# Patient Record
Sex: Female | Born: 1955 | Race: White | Hispanic: No | Marital: Married | Smoking: Former smoker
Health system: Southern US, Community
[De-identification: ages and names within clinical notes are randomized; demographics above are authoritative.]

## PROBLEM LIST (undated history)

## (undated) DIAGNOSIS — R9431 Abnormal electrocardiogram [ECG] [EKG]: Secondary | ICD-10-CM

## (undated) DIAGNOSIS — I10 Essential (primary) hypertension: Secondary | ICD-10-CM

## (undated) HISTORY — DX: Abnormal electrocardiogram (ECG) (EKG): R94.31

## (undated) HISTORY — PX: TUBAL LIGATION: SHX77

## (undated) HISTORY — PX: CHOLECYSTECTOMY: SHX55

## (undated) HISTORY — DX: Essential (primary) hypertension: I10

---

## 2001-11-24 ENCOUNTER — Encounter: Admission: RE | Admit: 2001-11-24 | Discharge: 2001-11-24 | Payer: Self-pay | Admitting: Family Medicine

## 2001-11-24 ENCOUNTER — Encounter: Payer: Self-pay | Admitting: Family Medicine

## 2002-08-17 ENCOUNTER — Encounter: Admission: RE | Admit: 2002-08-17 | Discharge: 2002-08-17 | Payer: Self-pay | Admitting: Family Medicine

## 2002-08-17 ENCOUNTER — Encounter: Payer: Self-pay | Admitting: Family Medicine

## 2002-09-01 ENCOUNTER — Other Ambulatory Visit: Admission: RE | Admit: 2002-09-01 | Discharge: 2002-09-01 | Payer: Self-pay | Admitting: Obstetrics and Gynecology

## 2003-01-28 ENCOUNTER — Emergency Department (HOSPITAL_COMMUNITY): Admission: EM | Admit: 2003-01-28 | Discharge: 2003-01-28 | Payer: Self-pay | Admitting: Emergency Medicine

## 2003-01-28 ENCOUNTER — Encounter: Payer: Self-pay | Admitting: Emergency Medicine

## 2003-02-09 ENCOUNTER — Encounter: Payer: Self-pay | Admitting: Internal Medicine

## 2003-02-09 ENCOUNTER — Encounter: Admission: RE | Admit: 2003-02-09 | Discharge: 2003-02-09 | Payer: Self-pay | Admitting: Internal Medicine

## 2003-02-17 ENCOUNTER — Emergency Department (HOSPITAL_COMMUNITY): Admission: EM | Admit: 2003-02-17 | Discharge: 2003-02-17 | Payer: Self-pay | Admitting: *Deleted

## 2003-02-17 ENCOUNTER — Encounter: Payer: Self-pay | Admitting: *Deleted

## 2003-02-18 ENCOUNTER — Encounter: Payer: Self-pay | Admitting: Internal Medicine

## 2003-02-18 ENCOUNTER — Ambulatory Visit (HOSPITAL_COMMUNITY): Admission: RE | Admit: 2003-02-18 | Discharge: 2003-02-18 | Payer: Self-pay | Admitting: Internal Medicine

## 2003-02-18 ENCOUNTER — Encounter (INDEPENDENT_AMBULATORY_CARE_PROVIDER_SITE_OTHER): Payer: Self-pay | Admitting: *Deleted

## 2003-09-05 ENCOUNTER — Other Ambulatory Visit: Admission: RE | Admit: 2003-09-05 | Discharge: 2003-09-05 | Payer: Self-pay | Admitting: Obstetrics and Gynecology

## 2003-10-13 ENCOUNTER — Emergency Department (HOSPITAL_COMMUNITY): Admission: EM | Admit: 2003-10-13 | Discharge: 2003-10-13 | Payer: Self-pay | Admitting: Emergency Medicine

## 2003-10-14 ENCOUNTER — Other Ambulatory Visit (HOSPITAL_COMMUNITY): Admission: RE | Admit: 2003-10-14 | Discharge: 2003-10-14 | Payer: Self-pay | Admitting: Psychiatry

## 2003-11-03 ENCOUNTER — Inpatient Hospital Stay (HOSPITAL_COMMUNITY): Admission: AD | Admit: 2003-11-03 | Discharge: 2003-11-18 | Payer: Self-pay | Admitting: Psychiatry

## 2003-11-07 ENCOUNTER — Encounter (HOSPITAL_COMMUNITY): Payer: Self-pay | Admitting: Psychiatry

## 2003-11-21 ENCOUNTER — Other Ambulatory Visit (HOSPITAL_COMMUNITY): Admission: RE | Admit: 2003-11-21 | Discharge: 2003-12-08 | Payer: Self-pay | Admitting: Psychiatry

## 2004-01-31 ENCOUNTER — Inpatient Hospital Stay (HOSPITAL_COMMUNITY): Admission: RE | Admit: 2004-01-31 | Discharge: 2004-02-16 | Payer: Self-pay | Admitting: Psychiatry

## 2004-03-08 ENCOUNTER — Inpatient Hospital Stay (HOSPITAL_COMMUNITY): Admission: RE | Admit: 2004-03-08 | Discharge: 2004-03-13 | Payer: Self-pay | Admitting: Psychiatry

## 2004-07-18 ENCOUNTER — Encounter: Admission: RE | Admit: 2004-07-18 | Discharge: 2004-07-18 | Payer: Self-pay | Admitting: Internal Medicine

## 2004-07-24 ENCOUNTER — Encounter: Admission: RE | Admit: 2004-07-24 | Discharge: 2004-07-24 | Payer: Self-pay | Admitting: Internal Medicine

## 2004-07-24 ENCOUNTER — Encounter (INDEPENDENT_AMBULATORY_CARE_PROVIDER_SITE_OTHER): Payer: Self-pay | Admitting: *Deleted

## 2004-08-13 ENCOUNTER — Ambulatory Visit: Payer: Self-pay | Admitting: Internal Medicine

## 2004-08-22 ENCOUNTER — Ambulatory Visit: Payer: Self-pay | Admitting: Internal Medicine

## 2004-11-15 ENCOUNTER — Ambulatory Visit: Payer: Self-pay | Admitting: Internal Medicine

## 2005-08-08 ENCOUNTER — Encounter: Admission: RE | Admit: 2005-08-08 | Discharge: 2005-08-08 | Payer: Self-pay | Admitting: Internal Medicine

## 2005-08-22 ENCOUNTER — Ambulatory Visit: Payer: Self-pay | Admitting: Internal Medicine

## 2005-08-23 ENCOUNTER — Ambulatory Visit: Payer: Self-pay | Admitting: Internal Medicine

## 2006-11-14 ENCOUNTER — Encounter: Admission: RE | Admit: 2006-11-14 | Discharge: 2006-11-14 | Payer: Self-pay | Admitting: Internal Medicine

## 2006-11-14 ENCOUNTER — Encounter: Payer: Self-pay | Admitting: Internal Medicine

## 2008-04-27 ENCOUNTER — Telehealth (INDEPENDENT_AMBULATORY_CARE_PROVIDER_SITE_OTHER): Payer: Self-pay | Admitting: *Deleted

## 2009-06-26 ENCOUNTER — Telehealth (INDEPENDENT_AMBULATORY_CARE_PROVIDER_SITE_OTHER): Payer: Self-pay | Admitting: *Deleted

## 2009-07-14 ENCOUNTER — Ambulatory Visit: Payer: Self-pay | Admitting: Internal Medicine

## 2009-07-14 DIAGNOSIS — E049 Nontoxic goiter, unspecified: Secondary | ICD-10-CM

## 2009-07-14 DIAGNOSIS — R002 Palpitations: Secondary | ICD-10-CM | POA: Insufficient documentation

## 2009-07-14 DIAGNOSIS — F329 Major depressive disorder, single episode, unspecified: Secondary | ICD-10-CM

## 2009-07-14 DIAGNOSIS — M810 Age-related osteoporosis without current pathological fracture: Secondary | ICD-10-CM

## 2009-07-21 ENCOUNTER — Encounter: Payer: Self-pay | Admitting: Internal Medicine

## 2009-07-21 ENCOUNTER — Encounter: Admission: RE | Admit: 2009-07-21 | Discharge: 2009-07-21 | Payer: Self-pay | Admitting: Internal Medicine

## 2009-07-24 ENCOUNTER — Encounter (INDEPENDENT_AMBULATORY_CARE_PROVIDER_SITE_OTHER): Payer: Self-pay | Admitting: *Deleted

## 2009-08-08 ENCOUNTER — Encounter (INDEPENDENT_AMBULATORY_CARE_PROVIDER_SITE_OTHER): Payer: Self-pay | Admitting: *Deleted

## 2010-02-05 ENCOUNTER — Encounter: Payer: Self-pay | Admitting: Internal Medicine

## 2010-02-23 ENCOUNTER — Ambulatory Visit: Payer: Self-pay | Admitting: Internal Medicine

## 2010-02-23 DIAGNOSIS — T887XXA Unspecified adverse effect of drug or medicament, initial encounter: Secondary | ICD-10-CM

## 2010-02-23 DIAGNOSIS — E559 Vitamin D deficiency, unspecified: Secondary | ICD-10-CM

## 2010-02-23 DIAGNOSIS — R740 Nonspecific elevation of levels of transaminase and lactic acid dehydrogenase [LDH]: Secondary | ICD-10-CM

## 2010-02-26 LAB — CONVERTED CEMR LAB
ALT: 77 units/L — ABNORMAL HIGH (ref 0–35)
AST: 49 units/L — ABNORMAL HIGH (ref 0–37)
Albumin: 4.4 g/dL (ref 3.5–5.2)
Alkaline Phosphatase: 85 units/L (ref 39–117)
Bilirubin, Direct: 0.2 mg/dL (ref 0.0–0.3)
Hgb A1c MFr Bld: 5.7 % (ref 4.6–6.5)
Total Bilirubin: 0.7 mg/dL (ref 0.3–1.2)
Total Protein: 7.4 g/dL (ref 6.0–8.3)

## 2010-07-20 ENCOUNTER — Telehealth (INDEPENDENT_AMBULATORY_CARE_PROVIDER_SITE_OTHER): Payer: Self-pay | Admitting: *Deleted

## 2010-08-24 ENCOUNTER — Encounter: Admission: RE | Admit: 2010-08-24 | Discharge: 2010-08-24 | Payer: Self-pay | Admitting: Internal Medicine

## 2010-10-27 ENCOUNTER — Encounter (HOSPITAL_COMMUNITY): Payer: Self-pay | Admitting: Psychiatry

## 2010-10-27 ENCOUNTER — Encounter: Payer: Self-pay | Admitting: Internal Medicine

## 2010-11-06 NOTE — Assessment & Plan Note (Signed)
Summary: DISCUSS LABS FROM DR [facility] OFFICE/RH......   Vital Signs:  Patient profile:   55 year old female Weight:      235 pounds Pulse rate:   82 / minute Resp:     15 per minute BP sitting:   122 / 86  (left arm) Cuff size:   large  Vitals Entered By: Shonna Chock (Feb 23, 2010 9:58 AM) CC: Discuss Labs  Comments REVIEWED MED LIST, PATIENT AGREED DOSE AND INSTRUCTION CORRECT    CC:  Discuss Labs .  History of Present Illness: Dr Carie Caddy  annual labs reviewed ; on 02/05/2010 ALT 262 (<41) & AST 92 ( < 39). Vitamin D was 36. Lipids were @ goal w/o statin. No specific diet; no alcohol, excess Tylenol or vitamin A supplementation. No PMH of hepatitis. Meds reviewed : Abilify is metabolized by liver as per Epocrates; Lamictil is not.  Allergies: 1)  ! Zithromax  Review of Systems General:  Denies chills and fever; Weight loss of 30# with decreased portions. GI:  Denies abdominal pain, bloody stools, dark tarry stools, indigestion, nausea, vomiting, and yellowish skin color; No  clay colored stool . GU:  No dark urine.  Physical Exam  General:  in no acute distress; alert,appropriate and cooperative throughout examination Eyes:  No corneal or conjunctival inflammation noted. Perrla. No icterus Mouth:  Oral mucosa and oropharynx without lesions or exudates.  No erythema Lungs:  Normal respiratory effort, chest expands symmetrically. Lungs are clear to auscultation, no crackles or wheezes. Heart:  Normal rate and regular rhythm. S1 and S2 normal without gallop, murmur, click, rub. S4 Abdomen:  Bowel sounds positive,abdomen soft and non-tender without masses, organomegaly or hernias noted. Skin:  Intact without suspicious lesions or rashes. No icterus Cervical Nodes:  No lymphadenopathy noted Axillary Nodes:  No palpable lymphadenopathy Psych:  memory intact for recent and remote, normally interactive, and good eye contact.     Impression & Recommendations:  Problem # 1:   NONSPEC ELEVATION OF LEVELS OF TRANSAMINASE/LDH (ICD-790.4) R/O drug effect vs Fatty Infiltration of liver(NASH) Orders: TLB-Hepatic/Liver Function Pnl (80076-HEPATIC) Venipuncture (16109)  Problem # 2:  VITAMIN D DEFICIENCY (ICD-268.9)  Problem # 3:  UNS ADVRS EFF UNS RX MEDICINAL&BIOLOGICAL SBSTNC (ICD-995.20)  Diabetes risk assessment necessary due to Abilify Rx & body habitus suggesting Metabolic Syndrome  Orders: TLB-A1C / Hgb A1C (Glycohemoglobin) (83036-A1C) Venipuncture (60454)  Complete Medication List: 1)  Boniva 150 Mg Tabs (Ibandronate sodium) .... Take 1 tablet by mouth each month 2)  Abilify 20 Mg Tabs (Aripiprazole) .Marland Kitchen.. 1 by mouth once daily 3)  Lomotrigine 25mg   .... 1 by mouth once daily 4)  Diltiazem Hcl Er Beads 240 Mg Xr24h-cap (Diltiazem hcl er beads) .Marland Kitchen.. 1 by mouth once daily 5)  Vitamin D 1000 Unit Tabs (Cholecalciferol) .Marland Kitchen.. 1 by mouth once daily 6)  Calcium 600 Mg Tabs (Calcium) .... 3 by mouth once daily  Patient Instructions: 1)  Consume LESS THAN 40 of "sugar" / day from foods & drinks with High Fructose Corn Syrup as #1,2 or  # 3 on label. Increase vitamin D 3 by 1000 IU daily.

## 2010-11-06 NOTE — Progress Notes (Signed)
Summary: Refill Request  Phone Note Refill Request Call back at [phone] Message from:  Pharmacy on July 20, 2010 8:01 AM  Refills Requested: Medication #1:  BONIVA 150 MG  TABS take 1 tablet by mouth each month   Dosage confirmed as above?Dosage Confirmed   Supply Requested: 1 month   Last Refilled: 06/21/2010 [facility]  Next Appointment Scheduled: none Initial call taken by: Harold Barban,  July 20, 2010 8:01 AM    Prescriptions: BONIVA 150 MG  TABS (IBANDRONATE SODIUM) take 1 tablet by mouth each month  #1 x 11   Entered by:   Shonna Chock CMA   Authorized by:   Marga Melnick MD   Signed by:   Shonna Chock CMA on 07/20/2010   Method used:   Electronically to        [facility]* (retail)       [address]       [place], [place]  [address]       Ph: [phone]       Fax: [phone]   RxID:   [id number]   Appended Document: Refill Request Called in rx to [facility], they didnt receive on 07/20/2010

## 2011-02-22 NOTE — Discharge Summary (Signed)
NAME:  Michelle Hunt, Michelle Hunt                      ACCOUNT NO.:  [id number]   MEDICAL RECORD NO.:  [id number]                   PATIENT TYPE:  IPS   LOCATION:  0300                                 FACILITY:  [facility]   PHYSICIAN:  Jeanice Lim, M.D.              DATE OF BIRTH:  Nov 14, 1955   DATE OF ADMISSION:  03/08/2004  DATE OF DISCHARGE:  03/13/2004                                 DISCHARGE SUMMARY   IDENTIFYING DATA:  This is a 55 year old married Caucasian female  voluntarily admitted with history of frequent admissions recently with  worsening mood, not able to tolerate multiple antidepressants, sensitive to  most medications, having various side effects at times objectively  witnessed, other times not clear.  The patient does report increased  paranoid thinking, suicidal thoughts, feeling unsafe outside the hospital,  gradual worsening in anxiety, which is the primary complaint along with  depressed mood, sense of doom, feeling that she is no longer going to be  able to make it and will not respond to medications since she has failed  just about every medicine on the market.  The patient is followed up by  Geoffery Lyons, M.D., outpatient.  This is the second hospitalization here;  last one two to three weeks ago.  No substance abuse history.   PAST MEDICAL HISTORY:  Primary care doctor is Dr. Mayford Knife in [place].  Hypertension is the only medical problem.   MEDICATIONS:  1. Klonopin, which she has been able to tolerate.  2. Depakote, which she took for two days.   She has been previously tried on multiple antipsychotics, mood stabilizers,  antidepressants, serotonin medicines, serotonin/norepinephrine medicines.   DRUG ALLERGIES:  CODEINE and possible CYMBALTA, questionable LAMICTAL.   PHYSICAL EXAMINATION:  GENERAL:  Essentially within normal limits.  NEUROLOGIC:  Nonfocal.   LABORATORY DATA:  Routine admission labs:  Within normal limits.   MENTAL STATUS EXAM:  Alert,  middle-aged female, cooperative, fair eye  contact.  Speech:  Clear.  Mood: Depressed.  Affect: Blunted, complaining of  significant anxiety and some paranoia, feeling that she is being watched  with anxiety as her primary complaint.  Cognitive: Intact.  Judgment and  insight: Fair.   ADMISSION DIAGNOSES:   AXIS I:  1. Rule out bipolar II disorder versus major depression, recurrent, severe     with psychotic features.  2. Generalized anxiety disorder refractory to medication treatment in part     due to tolerance of medications.   AXIS II:  Deferred.   AXIS III:  Hypertension.   AXIS IV:  Moderate problems with psychosocial stressors, limited support  system, chronic depression, and severe anxiety which has worsened over the  years.   AXIS V:  30/55   HOSPITAL COURSE:  The patient was admitted, ordered routine p.r.n.  medications, underwent further monitoring, and was encouraged to participate  in individual, group, and milieu therapy.  Upon admission, it was clear that  the patient was unable to tolerate medications that would treat her severe  anxiety and depression, which appeared to be gradually worsening and now  occurred along with suicidal thoughts and paranoid thinking.  Therefore, ECT  was recommended.  The patient was given ECT information video.  Family was  given information.  This was reviewed with the psychiatrist on several  occasions.  Risks, benefits, and alternative treatments were reviewed and it  was clear that it was in the patient's best interest and she was quite  motivated to receive ECT due to her feeling desperate and unsafe without  effective treatment and being unable to tolerate medications.  Geoffery Lyons,  M.D., recommended ECT, who is her outpatient psychiatrist and case  management facilitated transfer to [facility] for inpatient  stabilization of depression and anxiety via ECT.   CONDITION ON DISCHARGE:  The patient was discharged in an  unchanged  condition, still suicidal with paranoid ideation, able to contract for  safety until getting to the hospital for ECT.   DISCHARGE MEDICATIONS:  Medications were to be continued as on the  medication administration record.   FOLLOW UP:  She is to receive ECT at [facility] on June 7.   DISCHARGE DIAGNOSES:   AXIS I:  1. Rule out bipolar II disorder versus major depression, recurrent, severe     with psychotic features.  2. Generalized anxiety disorder refractory to medication treatment in part     due to tolerance of medications.   AXIS II:  Deferred.   AXIS III:  Hypertension.   AXIS IV:  Moderate problems with psychosocial stressors, limited support  system, chronic depression, and severe anxiety which has worsened over the  years.   AXIS V:  Global assessment of functioning on discharge was 45.                                               Jeanice Lim, M.D.    JEM/MEDQ  D:  03/28/2004  T:  03/30/2004  Job:  [id number]

## 2011-02-22 NOTE — H&P (Signed)
NAME:  Michelle Hunt, Michelle Hunt                      ACCOUNT NO.:  [id number]   MEDICAL RECORD NO.:  [id number]                   PATIENT TYPE:  IPS   LOCATION:  0300                                 FACILITY:  [facility]   PHYSICIAN:  Jeanice Lim, M.D.              DATE OF BIRTH:  08-Apr-1956   DATE OF ADMISSION:  11/03/2003  DATE OF DISCHARGE:                         PSYCHIATRIC ADMISSION ASSESSMENT   IDENTIFYING INFORMATION:  This is a  55 year old married white female who is  a voluntary admission.   HISTORY OF PRESENT ILLNESS:  This 55 year old female presented to the  hospital with her daughter after having a strange sensation come over her  while she was out shopping with her.  She had a flash of a vision of  stabbing her father and her son and became very scared by this.  This was  her first experience of having any kind of homicidal thought or homicidal  vision and she says that this is not typical of her.  She reports a history  of anxiety, feeling fearful, with rapid pulse and sweats, that has been  going on for the past several months, with symptoms first being noted in  March of 2004.  She reports she has been much more anxious over the past 3-4  weeks.  She thinks that her symptoms may have originated after a crisis at  work about 3 years ago when she got a new supervisor that made multiple  changes in her work routine.  The patient was hospitalized in August of 2004  at [facility] in an effort to get to the bottom of her symptoms and she  says they discharged her after 2 days on Remeron 15 mg at night.  She  describes difficulty sleep, with rapid pulse and sweating episodes, waking  up in the middle of the night, has had episodes of frequent panic and has  difficulty getting past these symptoms in spite of multiple attempts by her  family to reassure her.  Her father has taken to living with her because she  is afraid to be alone.  She has also complained of random muscle  aches and  pains.  The patient is terrified of getting panicky, feels out of control of  her feelings and her mood, and states I feel like someone has sucked the  life out of me.  She also reports frequent crying spells.  At one point,  she became angry and frustrated after leaving [facility] and chopped all  of her hair off, which she says is very unlike her.  She endorses feeling of  suicide, feeling she is unable to go on like this, but has no specific plan.   PAST PSYCHIATRIC HISTORY:  The patient has been followed by Dr. Rhodia Albright  and Lewie Loron who is her psychotherapist.  This is her first admission  to [facility].  She denies any childhood history of  abuse, denies any history of prior suicidal thoughts or suicide attempt.   SOCIAL HISTORY:  This is a married white female who lives at home with her  husband, and her father has been staying with her recently.  No legal  problems.  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  She reports her  husband and family are supportive.   FAMILY HISTORY:  Remarkable for a mother with a history of paranoia.   ALCOHOL AND DRUG HISTORY:  She denies any history of alcohol or drug use.   PAST MEDICAL HISTORY:  The patient's primary care physician is Dr. Eula Listen,  M.D.  She has also been to see Dr. Trey Sailors, a neurosurgeon, who found her  with a negative CT scan.  She has seen Dr. Mayford Knife in the past for  palpitations and had a negative cardiac workup, and was also seen by an  endocrinologist for workup of a thyroid nodule which was also essentially  negative.   MEDICATIONS:  Nexium 40 mg p.o. daily, Klonopin 0.5 mg 1 whole to 1/2 tab  p.o. t.i.d., Diltiazem 100 mg p.o. daily, fish oil 1000 mg 2 tabs b.i.d.,  Seroquel 25 mg p.o. q.h.s..  She tried taking Lexapro but reports that her  blood pressure went up on that.  She had also been on  a Climara patch to  which she was intolerant.  She has been using her Klonopin somewhat  irregularly.  The patient has also been on Lamictal in the past but she  found that it made her heart race and has also taken Elavil in the past but  states that it had made her very nervous so she stopped it.   REVIEW OF SYSTEMS:  Remarkable for feelings of anxiety, tension, she has had  considerable difficulty sleeping.  Denies any palpitations today, denies any  auditory or visual hallucinations, endorses anhedonia and a generalized  sense of anxiety.   POSITIVE PHYSICAL FINDINGS:  This is a well-nourished, well-developed white  female who is in no acute distress.  Vital signs on admission to the unit:  She is afebrile, pulse 95, respirations 20, blood pressure 158/70.  She is 5  feet 5 inches tall, weighs 129 pounds.  HEAD:  Normocephalic and atraumatic.  EENT:  Extraocular movements are  intact, sclerae are nonicteric.  Pupils equal round and reactive to light  and accommodation.  Ear canals are patent.  No rhinorrhea.  Oropharynx is  intact.  NECK:  Supple, no thyromegaly or lymphadenopathy.  CHEST:  Symmetrical, lungs are clear to auscultation.  CARDIOVASCULAR:  S1 and S2 is heard.  Her apical rate is regular at this  point at 68, synchronous with radial pulse.  Distal pulses are 2+/5.  EXTREMITIES:  Pink and warm with no peripheral edema.  No JVD.  ABDOMEN:  Flat, soft, nontender, nondistended.  GENITOURINARY and BREAST EXAM:  DEFERRED.  SKIN:  Intact.  Distal pulses 2+/5.  No rashes, no lesions.  NEUROLOGIC:  Cranial nerves II-XII intact.  EOMs are intact.  Cerebellar  function is intact and gait is normal.  Rapid alternating movements are  normal.  Facial and motor symmetry is present.  No focal findings.   DIAGNOSTIC STUDIES:  Normal CBC and metabolic panel.  Her thyroid panel is  currently pending.   MENTAL STATUS EXAM:  This is a fully alert female who is pleasant and cooperative,  with  a sad and blunted affect and a somewhat anxious affect.  Speech is normal in pace, tone and amount.  She initiates conversation well,  is quite articulate.  Mood is depressed and anxious.  Thought process is  negative for any racing thoughts, positive for vague, passive suicidal  ideation, feeling hopeless and helpless.  No homicidal ideation, no auditory  or visual hallucinations, no signs of internal distraction.  Cognitively she  is intact and oriented x3.  Intelligence is within normal limits.  Insight  is satisfactory.  Impulse control and judgment within normal limits.   ADMISSION DIAGNOSIS:   AXIS I:  Panic disorder not otherwise specified, rule out bipolar disorder.   AXIS II:  No diagnosis.   AXIS III:  Thyroid nodule not otherwise specified.   AXIS IV:  Moderate stress from her own mental illness and having supportive  family is an asset.   AXIS V:  Current 31, past year 48.   INITIAL PLAN OF CARE:  Voluntarily admit the patient with q.15 checks in  place.  We have spoken to Dr. Trey Sailors who was contacted by the patient's  son.  He states he is familiar with the patient and is concerned that she  get a thorough workup here.  Although he had a CT scan of her brain in March  of 2004, he had negative results.  He is concerned that patient has had some  recent significant symptoms of homicidal thoughts and possibly intrusive  thoughts and is offering his services to help [place] interpret any imaging  studies we may need.  We are going to put her on a regular dose of Klonopin  at 0.5 mg 1/2 tablet t.i.d. and we will recheck her thyroid series and go  from there.  We have discussed the plan of care with the patient and she is  in full agreement with this, and we have the case managers contact her  family with her agreement.   ESTIMATED LENGTH OF STAY:  5 days.     Margaret A. Stephannie Peters                   Jeanice Lim, M.D.    MAS/MEDQ  D:  11/10/2003  T:   11/10/2003  Job:  [id number]

## 2011-02-22 NOTE — H&P (Signed)
NAME:  Michelle Hunt, Michelle Hunt                      ACCOUNT NO.:  [id number]   MEDICAL RECORD NO.:  [id number]                   PATIENT TYPE:  IPS   LOCATION:  0300                                 FACILITY:  [facility]   PHYSICIAN:  Geoffery Lyons, M.D.                   DATE OF BIRTH:  1956-07-05   DATE OF ADMISSION:  03/08/2004  DATE OF DISCHARGE:                         PSYCHIATRIC ADMISSION ASSESSMENT   IDENTIFYING INFORMATION:  A 55 year old married white female, voluntarily  admitted on March 08, 2004.   HISTORY OF PRESENT ILLNESS:  The patient presents with a history of  depression, racing thoughts.  The patient states she has been wanting to cut  herself really bad.  The patient is afraid that someone is after her.  She  reports that she is having a lot of problems with the medication.  She  states she found herself waking up in the middle of the night after a new  medication was started recently.  The patient has continued anxiety, is  afraid to be alone.  She denies any auditory hallucinations.  Her appetite  has been satisfactory, with a 1 pound weight loss.   PAST PSYCHIATRIC HISTORY:  Second hospitalization, was here about 2-3 weeks  ago for depression, sees Dr. Dub Mikes as an outpatient.   SOCIAL HISTORY:  She is a 55 year old married white female, married for 30  years, has 2 children, worked as a Midwife for 15 years.   FAMILY HISTORY:  Unclear.   ALCOHOL DRUG HISTORY:  The patient smokes, denies any alcohol or drug use.   PAST MEDICAL HISTORY:  Primary care Jaquis Picklesimer is Dr. Mayford Knife in [place].  Medical problems are hypertension.   MEDICATIONS:  The patient has been on Klonopin 1 mg q.i.d., Depakote 250 mg  every evening for the past 2 days, Diltiazem 240 mg daily, Protonix 40 mg  daily.   DRUG ALLERGIES:  CODEINE and CYMBALTA which she reports a rash.   PHYSICAL EXAMINATION:  Done.  This is a well-nourished, well-developed  female in no acute distress.  97.4, 16  respirations, blood pressure is  146/100.  She is 5 feet 4 inches tall, 140 pounds.  Nonfocal neurological  findings.  Able to easily perform heel-to-shin and normal alternating  movements.  Hemoglobin is 15.3, valporic acid level is 27, urine drug screen  is positive for benzodiazepines.   MENTAL STATUS EXAM:  She is an alert middle-aged female, cooperative, fair  eye contact.  Casually dressed, speech clear, mood is depressed and afraid,  affect is constricted.  Thought processes positive paranoia, no delusions,  no ideas of reference, does not appear to be responding to internal stimuli.  Cognitive function intact, memory is good, judgment is fair, insight is  fair.   ADMISSION DIAGNOSES:   AXIS I:  Major depression, recurrent, severe, rule out bipolar disorder.   AXIS II:  Deferred.   AXIS III:  Hypertension.  AXIS IV:  Other psychosocial problems related to mental illness.   AXIS V:  Current is 30, past year 66.   PLAN:  Admission for depression and suicidal ideation and some questionable  paranoid ideation.  Contract for safety, stabilize mood and thinking.  At  this time, we will resume her medications and check Depakote level.  The  patient may need ECT treatments.  Will need to be educated and obtain family  support.  The patient is to follow up with Dr. Dub Mikes.   TENTATIVE LENGTH OF CARE:  4-6 days.     Landry Corporal, N.P.                       Geoffery Lyons, M.D.    JO/MEDQ  D:  03/13/2004  T:  03/13/2004  Job:  [id number]

## 2011-02-22 NOTE — Discharge Summary (Signed)
NAME:  Michelle Hunt, Michelle Hunt                      ACCOUNT NO.:  [id number]   MEDICAL RECORD NO.:  [id number]                   PATIENT TYPE:  IPS   LOCATION:  0305                                 FACILITY:  [facility]   PHYSICIAN:  Geoffery Lyons, M.D.                   DATE OF BIRTH:  May 15, 1956   DATE OF ADMISSION:  01/31/2004  DATE OF DISCHARGE:  02/16/2004                                 DISCHARGE SUMMARY   CHIEF COMPLAINT AND PRESENT ILLNESS:  This was the second admission to [facility] for this 55 year old married white female, who has  continued to experience symptoms of severe anxiety as well as mood  fluctuation.  Became very fearful for unknown reasons.  Unable to work.  Had  become more depressed, anxious.  Has been going on for a year, depressed as  well.  Worried about health problems.  Suicidal ruminations.  Suicidal  images.  Was admitted back in January.  She had a similar episode where she  had a flash of stabbing of her father and her son.  Became very scared.  Although she endorsed no suicidal or homicidal ideation.   PAST PSYCHIATRIC HISTORY:  Has been in [facility] as well as [facility].   ALCOHOL/DRUG HISTORY:  Denies the use or abuse of any substances.   PAST MEDICAL HISTORY:  Hypertension, gastroesophageal reflux, tachycardia.   MEDICATIONS:  Klonopin 0.5 mg three times a day, Neurontin 300 mg three  times a day, diltiazem 240 mg in the morning, Protonix 40 mg daily and  Ambien 10 mg at bedtime for sleep.   PHYSICAL EXAMINATION:  Performed and failed to show any acute findings.   LABORATORY DATA:  CBC within normal limits.  Blood chemistry within normal  limits.  TSH 0.563.  Drug screen negative for substances of abuse.   MENTAL STATUS EXAM:  Alert, oriented female.  Well-groomed.  Appropriately  dressed.  Speech was normal rate, rhythm and tone.  Mood was anxiety.  Affect was anxious.  Thought processes were clear, rational, goal-oriented,  somatically  focused but there was no evidence of paranoia or hallucinations.  Has had images of using a gun and hurting herself.  Cognition well-  preserved.   ADMISSION DIAGNOSES:   AXIS I:  1. Anxiety disorder not otherwise specified.  2. Mood disorder not otherwise specified.   AXIS II:  No diagnosis.   AXIS III:  1. Hypertension.  2. Gastroesophageal reflux.  3. Tachycardia.   AXIS IV:  Moderate.   AXIS V:  Global Assessment of Functioning upon admission 30; highest Global  Assessment of Functioning in the last year 55-60.   HOSPITAL COURSE:  She was admitted and started intensive individual and  group psychotherapy.  Reviewing her medications, she has been on Zoloft,  Cymbalta, Remeron, Effexor, Lexapro and Paxil.  Has also been on Lamictal  and Neurontin as well as Zyprexa, Seroquel and Risperdal  and Klonopin,  Ativan, Tranxene and Xanax with mostly CBS side effects, unable to tolerate  after appropriate dose.  A lot of physical complaints.  We continued to work  to explore options.  She was maintained on the Klonopin.  She was given some  Seroquel, which caused some sides and it was discontinued.  Neurontin was  discontinued and she was placed on Trileptal 150 mg three times a day and  Klonopin.  She was also started on Celexa 10 mg per day.  There was the  achiness and the pain.  She was given Flexeril and naproxen.  This was  contributing to making her sedated, so we discontinued it.  Continued to  work with the Celexa.  She continued to endorse the physical symptoms, the  pain, the crazy spells, the fear.  Pain in arms, feeling tense, anxious,  depressed.  Endorsing suicidal ruminations.  In the course of the stay, she  also became very upset when found out husband was drinking.  Started  catastrophizing and anticipating the worst.  She endorsed difficulty with  the Celexa, sedation, worrying, so we tried to simplify the medication  regime.  Fearful, sense of hopelessness and  helplessness, guilty for not  getting better, some anxiety, felt the Celexa was doing it, seeing visual  images of hurting self with a knife, was given Ativan but it was not strong  enough and it did not do much for her, so we started Geodon that she seemed  to tolerate.  We went Geodon 20 mg twice a day.  The fear was a major issue.  Tolerated the Geodon 20 mg twice a day and the Celexa 20 mg.  By Feb 16, 2004, she was better.  She said that she felt that she was ready to go home.  Endorsed no suicidal or homicidal ideation.  No major side effects to the  Geodon and the Celexa, so we went ahead and continued working with two  medications and she was discharged to outpatient follow-up.   DISCHARGE DIAGNOSES:   AXIS I:  1. Mood disorder not otherwise specified.  2. Anxiety disorder not otherwise specified.   AXIS II:  Deferred.   AXIS III:  1. Arterial hypertension.  2. Gastroesophageal reflux.   AXIS IV:  Moderate.   AXIS V:  Global Assessment of Functioning upon discharge 55.   DISCHARGE MEDICATIONS:  1. Klonopin 0.5 mg three times a day and 2 at bedtime.  2. Trileptal 150 mg, 1 three times a day.  3. Ambien 10 mg at bedtime for sleep.  4. Geodon 20 mg twice a day.  5. Celexa 20 mg, 1/2 twice a day.  6. Protonix 40 mg daily.   FOLLOWUP:  E[facility] Fines and Dr. Dub Mikes.                                               Geoffery Lyons, M.D.    IL/MEDQ  D:  03/07/2004  T:  03/08/2004  Job:  [id number]

## 2011-02-22 NOTE — Discharge Summary (Signed)
NAME:  Michelle Hunt, Michelle Hunt                      ACCOUNT NO.:  [id number]   MEDICAL RECORD NO.:  [id number]                   PATIENT TYPE:  IPS   LOCATION:  0300                                 FACILITY:  [facility]   PHYSICIAN:  Geoffery Lyons, M.D.                   DATE OF BIRTH:  Dec 11, 1955   DATE OF ADMISSION:  11/03/2003  DATE OF DISCHARGE:  11/18/2003                                 DISCHARGE SUMMARY   CHIEF COMPLAINT AND PRESENTING ILLNESS:  This was the first admission to  [facility]  for this 55 year old married white female,  voluntarily admitted.  Presented to the hospital with her daughter, having  had a strange sensation come over her when she was out shopping.  Flash of a  vision of stabbing her father and her son.  Became very scared.  First  experience of having any kind of homicidal thoughts or homicidal ideation.  She felt this was not typical of her.  History of anxiety, feeling fearful,  rapid pulse and sweats for past several months.  Symptoms first noted in  March 2004.  Much more anxious over the past 3-4 weeks prior to this  admission.  She claimed that she felt that the crisis originated after a  crisis at work 3 years prior to this admission when she got as new  supervisor that made multiple changes in her work route.  She was admitted  to [facility] in August 2004 for 2 days, discharged on Remeron.  Episodes  of frequent panic, difficult getting past these symptoms in despite of  multiple attempts by her family to reassure her.  Father had taken to living  with her because she was afraid to be alone.  Also complained of random  muscle aches and pain.  Terrified of getting panicky.  Out of control of her  feelings and her mood.  Reports frequent crying spells.   PAST PSYCHIATRIC HISTORY:  Followed by Dr. Gilles Chiquito and Eual Fines.   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  Reported that her husband and family were supportive.   ALCOHOL AND DRUG HISTORY:  Denies the use or abuse of any substances.   PAST MEDICAL HISTORY:  Has been seen by Dr. Channing Mutters, neurosurgeon, did a CT scan  which was negative.  Has seen Dr. Mayford Knife for consultation and negative  cardiac workup.  Also by an endocrinologist for workup of apparent thyroid  nodule which was essentially negative.   MEDICATIONS:  Nexium 40 mg daily, Klonopin 0.5 1/2 to 1 3 times a day as  needed, Diltiazem 100 mg daily, fish oil 1000 mg 2 tabs twice a day,  Seroquel 25 at bedtime.  Tried to use Lexapro but reports blood pressure  went up.  Using the Klonopin  somewhat irregularly.  Had been on Lamictal in  the past but found it made her heart race, also Elavil was stopped, both for  the same reason.   PHYSICAL EXAMINATION:  Performed, failed to show any acute findings.   LABORATORY WORKUP:  CBC within normal limits.  Blood chemistries were within  normal limits.  Liver profile within normal limits.  Thyroid profile within  normal limits.   MENTAL STATUS EXAM:  Reveals a fully alert female, pleasant, cooperative,  with a sad and blunted affect, somewhat anxious.  Speech was normal in pace,  tone and amount.  Initiates conversation well, quite articulate.  Mood is  depressed and anxious.  Thought processes are negative for any racing  thoughts, positive for vague, passive suicidal ideas, feeling hopeless and  helpless.  No homicidal ideas, no hallucinations.  Cognition well preserved.   ADMISSION DIAGNOSES:   AXIS I:  1. Anxiety disorder not otherwise specified.  2. Mood disorder not otherwise specified.   AXIS II:  No diagnosis.   AXIS III:  Thyroid nodules.   AXIS IV:  Moderate.   AXIS V:  Global assessment of function upon admission 31, highest global  assessment of function in past year 68.   COURSE IN HOSPITAL:  She  was admitted and started on  intensive individual  and group psychotherapy.  She was given Ambien for sleep, Nexium 40 mg per  day, Klonopin 0.5 1/2 to 1 3 times a day, Diltiazem 240 mg daily, fish oil  1000 2 caps twice a day.  She was initially very, very resistant to try any  medications.  She was able to talk about the development of the symptoms,  the panic, the generalized anxiety, the images of hurting her son and  father, almost compulsive in nature.  She has used Lexapro, Remeron, Paxil,  Xanax, Elavil with side effects or no benefit.  We discussed the possibility  of using Zoloft starting really low at 12.5 and because she was denying any  suicidal or homicidal ideation, but remained mostly somatically focused.  Continued to evidence the anxiety and the depression, fearful of the  medications, was eventually able and willing to take the Zoloft 12.5.  Fear  of being out of control.  Felt quite uneasy, unsure of what was going on,  easily overwhelmed, with minimal physical symptoms.  Experiencing some  temperature changes, cold, hot.  We checked the TSH and LH which basically  were within normal range.  Continued to endorse physical complaints, waking  up with muscle aches, which triggered the worries and the complaints,  feeling not well.  She was seen by internal medicine to rule out any  possible disease or any of the fatty disorders.  Evaluation was within  normal limits.  At one time she was very concerned that the husband might  have been back drinking, but he was able to reassure her that he was not.  She started feeling that she at least was able to tolerate the Zoloft and we  pursued the Zoloft further.  Continued to endorse the anxiety and the  depression.  Somatically focused, worried, concerned.  We went ahead and  added Neurontin 100 3 times a day for any possible mood swings and anxiety.  She developed symptoms of becoming very irritable, very frustrated.  The internal medicine diagnosis was the  possibility of fibromyalgia.  Session  with her son as well as her husband.  She started experiencing what seemed  to be  when she was somewhat brighter, evidenced a fuller affect.  We  discussed other options in terms of cognitive behavioral therapy, identified  some of the distortions in terms of catastrophizing and all these negative  thoughts that came into her mind.  Continued to work with the Zoloft and was  able to increase it to 50, continued to work with the [employer].  Still  reporting changes in internal temperature.  Some other events came.  She was  able to look into them.  We went ahead and increased the Zoloft to 25 3  times a day.  Endorsed that she took naps and woke up upset, especially in  the afternoon.  Encouraged not to take the naps as this was going to help  her be able to fall asleep and stay asleep through the night.  Encouraged to  challenge the negative thoughts.  On February 11 it was felt that she had  obtained full benefit and she was indeed much better.  There was no evidence  of suicidal or homicidal ideas, no hallucinations, no delusions.  Objectively she was better.  Had grown dependent on the safety of the  inpatient unit.  It was felt that she was tolerating the medication well and  that she needed some more time for the medication to really work as well as  it could.  She was willing to come to the mental health intensive outpatient  program, so on February 11 we went ahead and discharged to outpatient  followup.   DISCHARGE DIAGNOSES:   AXIS I:  1. Anxiety disorder not otherwise specified.  2. Mood disorder not otherwise specified.   AXIS II:  No diagnosis.   AXIS III:  Thyroid nodule.   AXIS IV:  Moderate.   AXIS V:  Global assessment of function upon discharge 50.   DISCHARGE MEDICATIONS:  1. Neurontin 100 two 3 times a day.  2. Nexium 40 mg per day.  3. Diltiazem 240 mg daily.  4. Klonopin 0.5 1/2 twice a day and at bedtime.  5. Zoloft  25 mg one 3 times a day.  6. Ambien 10 at bedtime for sleep.   DISPOSITION:  Follow up at mental health IOP.                                               Geoffery Lyons, M.D.    IL/MEDQ  D:  12/14/2003  T:  12/15/2003  Job:  [id number]

## 2011-02-22 NOTE — H&P (Signed)
NAME:  Michelle Hunt, Michelle Hunt                      ACCOUNT NO.:  [id number]   MEDICAL RECORD NO.:  [id number]                   PATIENT TYPE:  IPS   LOCATION:  0305                                 FACILITY:  [facility]   PHYSICIAN:  Geoffery Lyons, M.D.                   DATE OF BIRTH:  1956-05-09   DATE OF ADMISSION:  01/31/2004  DATE OF DISCHARGE:                         PSYCHIATRIC ADMISSION ASSESSMENT   This is a 55 year old married white female who presented for admission this  morning.  The patient was here with [place] back in January under similar  circumstances.  Today she reports she is having increased anxiety.  She is  very fearful for unknown reasons.  She is unable to work because of this.  She has now become depressed.  Initially she was just anxious, but as this  has been going on for over a year now, she is depressed as well. She worries  about her elderly parents who have health problems, and yesterday she began  to have suicidal ideation with plan to get a gun and blow her brains out.  She actually visualized herself doing so.   Back in January, she suddenly had a similar episode where she had a flash of  stabbing her father and her son.  This made her become very scared.  Ordinarily she has no homicidal or suicidal ideations.  Since her discharge  in February, she has been followed in the private practice of Dr. Geoffery Lyons.   PAST PSYCHIATRIC HISTORY:  She was admitted to [facility] in August 2004, in  January 2005 to [facility].   SOCIAL HISTORY:  She finished the 12th grade.  She is a Recruitment consultant.  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  She has not been able to work.   FAMILY HISTORY:  Her mother is treated for depression and paranoid with  Mellaril.  She has successfully taken Mellaril for over 18 years.  She was  originally prescribed by Dr. Janann August.   ALCOHOL AND DRUG HISTORY:  The patient smokes cigarettes,  less than a pack  per day.   Her medical primary care Raphaela Cannaday is Dr. Donette Larry at [facility].  She is  being treated for hypertension, GERD, and tachycardia.   CURRENT MEDICATIONS:  1. Klonopin 0.5 mg t.i.d.  2. Gabapentin 300 mg t.i.d.  3. Diltiazem 240 mg q.a.m.  4. Protonix 40 mg q.a.m.  5. Ambien 10 mg at h.s. p.r.n.   ALLERGIES:  The patient acknowledges drug allergies to CODEINE.  She states  that when she took LEXAPRO, her blood pressure went up, and she states that  she experienced tachycardia when taking ELAVIL and LAMICTAL.   PHYSICAL EXAMINATION:  GENERAL:  Well-developed, well-nourished white female  who appears her stated age.  She reports a 20-pound weight gain.  I do not  have her  old records to confirm this.  HEENT:  Within normal limits.  HEART:  Regular rate and rhythm without murmur, rub, or gallops.  VITAL SIGNS:  At the moment, her pulse is 76.  LUNGS:  Clear.  ABDOMEN:  Soft with no mass, organomegaly, or tenderness.  MUSCULOSKELETAL:  No clubbing, cyanosis, or edema.  NEUROLOGIC:  She is intact.  She is nonfocal.  She is postmenopausal.   MENTAL STATUS EXAM:  She is alert and oriented x 3.  She is well-groomed,  appropriately dressed.  Her speech is normal rate, rhythm, and tone.  Her  mood is anxious.  Her affect is congruent.  Her thought processes are clear,  rational, and goal oriented.  She denies paranoia or delusions.  She denies  auditory or visual hallucinations, although she does acknowledge having this  picture yesterday of herself procuring a gun and hurting herself.  Her  intelligence level is at least average.  Her judgment and insight are  intact.  Her concentration and memory are intact.   ADMISSION DIAGNOSES:   AXIS I:  1. Anxiety disorder.  2. Major depressive disorder, recurrent, severe, with psychotic features.  3. Visual hallucinations.  4. She is also acknowledging floaters at this time.   AXIS II:  Deferred.   AXIS  III:  1. Hypertension.  2. Gastroesophageal reflux disease.  3. Tachycardia.   AXIS IV:  Severe.  She cannot work right now.   AXIS V:  30.   PLAN:  Provide safety and stabilization and to optimize her medications.  Given that her mother is stable on Mellaril, I suggested that we reinstitute  a trial of Seroquel.  She has only taken 25 mg in the past.  She is morbidly  afraid of medication and actually increases her anxiety with her fear.  My  plan is to start 150 mg at bedtime tonight and escalate that to 300  tomorrow.     Mickie Leonarda Salon, P.A.-C.               Geoffery Lyons, M.D.    MD/MEDQ  D:  01/31/2004  T:  01/31/2004  Job:  [id number]

## 2011-10-15 ENCOUNTER — Other Ambulatory Visit: Payer: Self-pay | Admitting: Internal Medicine

## 2011-10-15 DIAGNOSIS — Z1231 Encounter for screening mammogram for malignant neoplasm of breast: Secondary | ICD-10-CM

## 2011-10-29 ENCOUNTER — Ambulatory Visit
Admission: RE | Admit: 2011-10-29 | Discharge: 2011-10-29 | Disposition: A | Discharge status: Home / Self Care | Payer: Medicare Other | Source: Ambulatory Visit | Attending: Internal Medicine | Admitting: Internal Medicine

## 2011-10-29 DIAGNOSIS — Z1231 Encounter for screening mammogram for malignant neoplasm of breast: Secondary | ICD-10-CM

## 2013-06-24 ENCOUNTER — Other Ambulatory Visit: Payer: Self-pay

## 2013-06-24 DIAGNOSIS — Z1231 Encounter for screening mammogram for malignant neoplasm of breast: Secondary | ICD-10-CM

## 2013-07-09 ENCOUNTER — Ambulatory Visit
Admission: RE | Admit: 2013-07-09 | Discharge: 2013-07-09 | Disposition: A | Discharge status: Home / Self Care | Payer: Medicare HMO | Source: Ambulatory Visit

## 2013-07-09 DIAGNOSIS — Z1231 Encounter for screening mammogram for malignant neoplasm of breast: Secondary | ICD-10-CM

## 2013-07-12 ENCOUNTER — Telehealth: Payer: Self-pay | Admitting: Cardiology

## 2013-07-12 DIAGNOSIS — I10 Essential (primary) hypertension: Secondary | ICD-10-CM

## 2013-07-12 NOTE — Telephone Encounter (Signed)
Please find out if she is referring to Cardizem

## 2013-07-12 NOTE — Telephone Encounter (Signed)
Diastolic BP is elevated.  Please get last OV for my review and find out what patient meant by salt take

## 2013-07-12 NOTE — Telephone Encounter (Signed)
New problem   Pt is calling to give bp reading as following. Pt is having salt take from new bp medication she was giving. Please advise pt.  9/29     126/88       9/30     137/94 10/1     135/92 10/2     139/83 10/3     139/88

## 2013-07-12 NOTE — Telephone Encounter (Signed)
Yes to her Cardizem

## 2013-07-12 NOTE — Telephone Encounter (Signed)
Pt meant to say that ever since she started the new BP medication it has caused her to have her mouth constantly taste like salt.

## 2013-07-13 MED ORDER — HYDROCHLOROTHIAZIDE 25 MG PO TABS: 25.0000 mg | ORAL_TABLET | Freq: Every day | ORAL | Status: DC

## 2013-07-13 NOTE — Telephone Encounter (Signed)
Pt aware that Dr. Mayford Knife knows  of Cardizem salty taste side effect, and due to pt having a high diastolic BP' pt needs to start HCTZ 25 mg daily in addition to Cardizem. Pt  is to have blood work Nutritional therapist in one week. A Prescription for HCTZ 25 mg tablet pt to take one tablet by mouth daily dispense 30 pills and 6 refills sent to [facility] in [place]. Pt is scheduled for blood work on 07/21/13. Pt aware of MD's recommendations and appointment for labs.

## 2013-07-13 NOTE — Telephone Encounter (Signed)
This is not a side effect of cardizem from what I've seen clinically, or from why I can find in drug references.

## 2013-07-13 NOTE — Addendum Note (Signed)
Addended by: Ollen Gross L on: 07/13/2013 12:16 PM   Modules accepted: Orders

## 2013-07-13 NOTE — Telephone Encounter (Signed)
Any known side effect of salty taste in mouth with Cardizem?

## 2013-07-13 NOTE — Telephone Encounter (Signed)
I am unaware that Cardizem causes a salty taste in the mouth.  Diastolic BP still too high.  Please have patient start HCTZ 25mg  daily in addition to Cardizem.  Check BMET in 1 week (ICD 401.1)

## 2013-07-13 NOTE — Telephone Encounter (Signed)
Please let patient know that I checked with the pharmacists and the complaint of salty taste in mouth is not a side effect of Cardizem

## 2013-07-14 NOTE — Telephone Encounter (Signed)
Pt states it has happened since the brand of the medication was changed when she moved from [place]. She stated she will try to deal with it but its very bad. She will call back if it is to much for her.

## 2013-07-14 NOTE — Telephone Encounter (Signed)
Please find out if she was taking the generic or brand name in the past

## 2013-07-19 ENCOUNTER — Telehealth: Payer: Self-pay | Admitting: Cardiology

## 2013-07-19 NOTE — Telephone Encounter (Signed)
Spoke with patient who states that she began taking HCTZ 25 mg last Wed. (10/8) and that since that time she has experienced "head spinning", elevated BP and HR.  She states it was so bad on Thursday that she spent the day in bed.  Patient states she took the medication through Saturday but has not taken it since because her BP/HR Saturday were 142/104, 110.  Patient states she feels well today.  Patient states she is currently taking Diltiazem 300 mg QD for hypertension.  I advised patient that Dr. Mayford Knife is not in the office today but that I would send the message to Dr. Mayford Knife and her primary CMA, Launa Flight aand that I would advise her to stay off of the HCTZ until she hears back from Dr. Mayford Knife or Duwayne Heck.  Patient verbalized understanding and agreement with plan.

## 2013-07-19 NOTE — Telephone Encounter (Signed)
New problem:  Pt states her medication caused her BP and heart rate to go to 142/101 pulse 110 on Saturday. Pt states she stopped taking her med. Bp today was 127/79.Michelle Hunt Pt would like to be advised.

## 2013-07-20 NOTE — Telephone Encounter (Signed)
To Dr Turner to advise 

## 2013-07-20 NOTE — Telephone Encounter (Signed)
Please have her stop HCTZ and start Lisinopril 10mg  daily.  Continue to check BP daily and call with results in a week.  She will need a BMET in 1 week

## 2013-07-21 ENCOUNTER — Other Ambulatory Visit: Payer: Medicare HMO

## 2013-07-21 NOTE — Telephone Encounter (Signed)
Pt stated she does not feel well still and would rather wait a few more days until starting a new medication. Ok to wait a few days before starting?

## 2013-07-21 NOTE — Telephone Encounter (Signed)
Pt is aware and will call us with BP and pulse rate for the week.

## 2013-07-21 NOTE — Telephone Encounter (Signed)
Please have her check her BP and HR and call with results

## 2013-07-29 ENCOUNTER — Telehealth: Payer: Self-pay | Admitting: Cardiology

## 2013-07-29 NOTE — Telephone Encounter (Signed)
To Dr Turner to advise 

## 2013-07-29 NOTE — Telephone Encounter (Signed)
Pt is aware.  

## 2013-07-29 NOTE — Telephone Encounter (Signed)
follow up    Pt calling in BP for the week of 10/15 -10/22  10/15      117/85  10/16      118/83  10/17      131/83  10/18       107/78  10/19      103/76  10/20      111/83  10/21     108/76  10/22      109/74      Thanks!

## 2013-07-29 NOTE — Telephone Encounter (Signed)
BP well controlled - continue current medical therapy

## 2014-06-14 ENCOUNTER — Telehealth: Payer: Self-pay | Admitting: Cardiology

## 2014-06-14 ENCOUNTER — Other Ambulatory Visit: Payer: Self-pay

## 2014-06-14 DIAGNOSIS — Z1231 Encounter for screening mammogram for malignant neoplasm of breast: Secondary | ICD-10-CM

## 2014-06-14 MED ORDER — DILTIAZEM HCL ER COATED BEADS 300 MG PO TB24: 300.0000 mg | ORAL_TABLET | Freq: Every day | ORAL | Status: DC

## 2014-06-14 NOTE — Telephone Encounter (Signed)
Pt is aware.  

## 2014-06-14 NOTE — Telephone Encounter (Signed)
New problem:   Per pt need med called in DILTIADEM 300 mg 90 day called in to [facility] in [place].  Please call pt when done.

## 2014-06-14 NOTE — Telephone Encounter (Signed)
Rx called in for pt

## 2014-07-15 ENCOUNTER — Ambulatory Visit
Admission: RE | Admit: 2014-07-15 | Discharge: 2014-07-15 | Disposition: A | Discharge status: Home / Self Care | Payer: Medicare PPO | Source: Ambulatory Visit

## 2014-07-15 DIAGNOSIS — Z1231 Encounter for screening mammogram for malignant neoplasm of breast: Secondary | ICD-10-CM

## 2014-08-19 ENCOUNTER — Encounter: Payer: Self-pay | Admitting: Cardiology

## 2014-08-19 ENCOUNTER — Ambulatory Visit (INDEPENDENT_AMBULATORY_CARE_PROVIDER_SITE_OTHER): Payer: Medicare PPO | Admitting: Cardiology

## 2014-08-19 ENCOUNTER — Ambulatory Visit: Payer: Medicare PPO | Admitting: Cardiology

## 2014-08-19 VITALS — BP 150/100 | HR 110 | Ht 65.0 in | Wt 271.2 lb

## 2014-08-19 DIAGNOSIS — R000 Tachycardia, unspecified: Secondary | ICD-10-CM

## 2014-08-19 DIAGNOSIS — R9431 Abnormal electrocardiogram [ECG] [EKG]: Secondary | ICD-10-CM

## 2014-08-19 DIAGNOSIS — I10 Essential (primary) hypertension: Secondary | ICD-10-CM | POA: Insufficient documentation

## 2014-08-19 DIAGNOSIS — I471 Supraventricular tachycardia: Secondary | ICD-10-CM

## 2014-08-19 HISTORY — DX: Essential (primary) hypertension: I10

## 2014-08-19 LAB — TSH: TSH: 1 u[IU]/mL (ref 0.35–4.50)

## 2014-08-19 MED ORDER — METOPROLOL SUCCINATE ER 25 MG PO TB24: 25.0000 mg | ORAL_TABLET | Freq: Every day | ORAL | Status: DC

## 2014-08-19 NOTE — Addendum Note (Signed)
Addended by: Gunnar FusiKEMP, KATHRYN A on: 08/19/2014 03:29 PM   Modules accepted: Orders

## 2014-08-19 NOTE — Progress Notes (Signed)
  [address] [place], [place]  [address] Phone: [phone] Fax:  [phone]  Date:  08/19/2014   ID:  Peggye FormChristine M Hunt, DOB [birth year], MRN [id number]  PCP:  Marga MelnickWilliam Hopper, MD  Cardiologist:  Armanda Magicraci Taletha Twiford, MD    History of Present Illness: Michelle MainsChristine M Hunt is a 58 y.o. female with a history of HTN who presents today for followup of her BP.  She is doing well.  SHe denies any chest pain, SOB, DOE, LE edema, dizziness, palpitations or syncope.  She says that her BP at home as been running 138/8795mmHg.     Wt Readings from Last 3 Encounters:  08/19/14 271 lb 3.2 oz (123.016 kg)  02/23/10 235 lb (106.595 kg)  07/14/09 257 lb 3.2 oz (116.665 kg)     Past Medical History  Diagnosis Date  . Essential hypertension 08/19/2014    Current Outpatient Prescriptions  Medication Sig Dispense Refill  . ABILIFY 30 MG tablet     . lamoTRIgine (LAMICTAL) 25 MG tablet     . TAZTIA XT 300 MG 24 hr capsule   0   No current facility-administered medications for this visit.    Allergies:    Allergies  Allergen Reactions  . Azithromycin     Social History:  The patient  reports that she has quit smoking. She does not have any smokeless tobacco history on file.   Family History:  The patient's family history is not on file.   ROS:  Please see the history of present illness.      All other systems reviewed and negative.   PHYSICAL EXAM: VS:  BP 150/100 mmHg  Pulse 110  Ht 5\' 5"  (1.651 m)  Wt 271 lb 3.2 oz (123.016 kg)  BMI 45.13 kg/m2 Well nourished, well developed, in no acute distress HEENT: normal Neck: no JVD Cardiac:  normal S1, S2; RRR; no murmur Lungs:  clear to auscultation bilaterally, no wheezing, rhonchi or rales Abd: soft, nontender, no hepatomegaly Ext: no edema Skin: warm and dry Neuro:  CNs 2-12 intact, no focal abnormalities noted  EKG:     Sinus tachycardia at 110bpm, inferior infarct and anterior infarct  ASSESSMENT AND PLAN:  1. HTN  poorly controlled - continue Taztia - Add Toprol 25mg  daily for better HR and BP control 2. Abnormal EKG with new inferior infarct on EKG - I suspect that this is due to lead placement since she has very large breasts but I will get an echo to make sure there is no wall motion abnormality  3. Sinus tachycardia - check TSH  Followup with nurse in office in 2 weeks  Followup with me in 1 year  Signed, Armanda Magicraci Rinaldo Macqueen, MD [facility] 08/19/2014 2:59 PM

## 2014-08-19 NOTE — Patient Instructions (Signed)
Your physician has recommended you make the following change in your medication:  1) START Toprol XL 25 mg daily.  Your physician has requested that you have an echocardiogram. Echocardiography is a painless test that uses sound waves to create images of your heart. It provides your doctor with information about the size and shape of your heart and how well your heart's chambers and valves are working. This procedure takes approximately one hour. There are no restrictions for this procedure.  Your physician recommends that you have lab work today (TSH).   Your physician wants you to follow-up in: 1 year with Dr. Mayford Knifeurner. You will receive a reminder letter in the mail two months in advance. If you don't receive a letter, please call our office to schedule the follow-up appointment.

## 2014-08-22 ENCOUNTER — Telehealth: Payer: Self-pay | Admitting: Cardiology

## 2014-08-22 NOTE — Telephone Encounter (Signed)
Returned patient's call. Patient asking if she is to take metoprolol and taztia. Instructed patient to take both medications.  Gave patient results of recent lab work. Instructed patient to call with any other questions.

## 2014-08-22 NOTE — Telephone Encounter (Signed)
New message    Patient calling has questions regarding blood pressure medicine.

## 2014-09-09 ENCOUNTER — Other Ambulatory Visit (HOSPITAL_COMMUNITY): Payer: Medicare PPO

## 2014-09-09 ENCOUNTER — Ambulatory Visit (HOSPITAL_COMMUNITY): Payer: Medicare PPO | Attending: Cardiology | Admitting: Radiology

## 2014-09-09 ENCOUNTER — Ambulatory Visit (INDEPENDENT_AMBULATORY_CARE_PROVIDER_SITE_OTHER): Payer: Medicare PPO | Admitting: *Deleted

## 2014-09-09 VITALS — BP 153/89 | HR 86 | Wt 277.0 lb

## 2014-09-09 DIAGNOSIS — I471 Supraventricular tachycardia: Secondary | ICD-10-CM

## 2014-09-09 DIAGNOSIS — I10 Essential (primary) hypertension: Secondary | ICD-10-CM

## 2014-09-09 DIAGNOSIS — R000 Tachycardia, unspecified: Secondary | ICD-10-CM

## 2014-09-09 DIAGNOSIS — R9431 Abnormal electrocardiogram [ECG] [EKG]: Secondary | ICD-10-CM | POA: Insufficient documentation

## 2014-09-09 MED ORDER — METOPROLOL SUCCINATE ER 50 MG PO TB24: 50.0000 mg | ORAL_TABLET | Freq: Every day | ORAL | Status: DC

## 2014-09-09 NOTE — Patient Instructions (Signed)
INCREASE YOUR TOPROL TO 50 MG DAILY  Your physician recommends that you schedule a follow-up appointment in: Blood pressure check in about 2 weeks

## 2014-09-09 NOTE — Progress Notes (Signed)
Echocardiogram performed.  

## 2014-09-09 NOTE — Progress Notes (Signed)
Patient in office today for follow up blood pressure check and EKG. EKG NSR HR 86, blood pressure 153/89. Medications reviewed with patient. Patient states she is feeling good. Reviewed EKG and vital signs with Dr Clifton JamesMcAlhany and will have patient increase Toprol XL from 25 mg to 50 mg daily. Follow up in about 2 weeks for repeat blood pressure. Patient stated she will be getting new blood pressure machine, advised to bring with her to blood pressure check. Patient agreeable to plan.

## 2014-09-12 ENCOUNTER — Other Ambulatory Visit: Payer: Self-pay | Admitting: Cardiology

## 2014-10-04 ENCOUNTER — Ambulatory Visit (INDEPENDENT_AMBULATORY_CARE_PROVIDER_SITE_OTHER): Payer: Medicare PPO | Admitting: *Deleted

## 2014-10-04 VITALS — BP 132/88 | HR 88 | Resp 20 | Ht 65.0 in | Wt 281.0 lb

## 2014-10-04 DIAGNOSIS — I10 Essential (primary) hypertension: Secondary | ICD-10-CM

## 2014-10-04 NOTE — Progress Notes (Signed)
1.) Reason for visit: Increase in Toprol XL from 25 mg to 50 mg po daily due to HTN.   2.) Name of MD requesting visit: Dr Mayford Knifeurner and DOD on 09/09/14 Dr Clifton JamesMcAlhany  3.) H&P: Pt has a hx of HTN   4.) ROS related to problem: Pt reports to the office as instructed by DOD Dr Clifton JamesMcAlhany on 09/09/14, at pts last nurse visit, to reassess BP due to increase in Toprol XL from 25 mg to 50 mg po daily. Pt states she has been monitoring her BP daily at home, but forgot to bring it today, as instructed at last nurse visit on 12/4. Pts current VS today are :  BP- 132/88-HR-88-R-20-SPO2 98 % RA. Pt denies any cp, sob, HA, blurred vision, dizziness, LEE, DOE, palpitations,  syncopal or pre-syncopal episodes at this time. Pt reports her BP from yesterday 12/28 was 128/82. Pt states "I'm feeling better since the increase in my medicine."  Pt states she's compliant with taking all medications prescribed. Went to pts Primary Cardiologist Dr Mayford Knifeurner for further review of BP assessment, and per Dr Mayford Knifeurner the pt should continue on same regimen of Toprol XL 50 mg po daily, and come in for a one year OV with her,as discussed at last OV. Informed the pt of recommendations per Dr Mayford Knifeurner, and she verbalized understanding and agrees with this plan. Will forward note to Dr Mayford Knifeurner for review and to close.  5.) Assessment and plan per MD: Per Dr Mayford Knifeurner, pts Primary Cardiologist, the pt should continue current regimen of Toprol XL 50 po once daily and come back for follow-up OV in one year as discussed at last OV.  I have reviewed the above data and agree with assessment and plan  Signed: Armanda Magicraci Turner, MD [facility] 10/04/2014

## 2014-10-04 NOTE — Patient Instructions (Signed)
Your physician recommends that you continue on your current medications as directed. Please refer to the Current Medication list given to you today.   (CONTINUE TAKING YOUR TOPROL XL 50 MG ONCE DAILY)   Your physician wants you to follow-up in: ONE YEAR WITH DR TURNER AS MENTIONED AT LAST OFFICE VISIT You will receive a reminder letter in the mail two months in advance. If you don't receive a letter, please call our office to schedule the follow-up appointment.

## 2015-02-23 ENCOUNTER — Other Ambulatory Visit: Payer: Self-pay | Admitting: Cardiovascular Disease

## 2015-06-21 ENCOUNTER — Encounter: Payer: Self-pay | Admitting: Cardiology

## 2015-07-10 ENCOUNTER — Other Ambulatory Visit: Payer: Self-pay

## 2015-07-10 DIAGNOSIS — Z1231 Encounter for screening mammogram for malignant neoplasm of breast: Secondary | ICD-10-CM

## 2015-07-18 ENCOUNTER — Ambulatory Visit: Payer: Medicare PPO

## 2015-07-25 ENCOUNTER — Ambulatory Visit
Admission: RE | Admit: 2015-07-25 | Discharge: 2015-07-25 | Disposition: A | Discharge status: Home / Self Care | Payer: Medicare PPO | Source: Ambulatory Visit

## 2015-07-25 DIAGNOSIS — Z1231 Encounter for screening mammogram for malignant neoplasm of breast: Secondary | ICD-10-CM

## 2015-08-09 ENCOUNTER — Encounter: Payer: Self-pay | Admitting: Cardiology

## 2015-08-22 ENCOUNTER — Other Ambulatory Visit: Payer: Self-pay | Admitting: Cardiovascular Disease

## 2015-09-09 ENCOUNTER — Other Ambulatory Visit: Payer: Self-pay | Admitting: Cardiology

## 2015-09-19 ENCOUNTER — Ambulatory Visit: Payer: Medicare PPO | Admitting: Cardiology

## 2015-10-10 ENCOUNTER — Other Ambulatory Visit: Payer: Self-pay | Admitting: Cardiology

## 2015-10-17 ENCOUNTER — Ambulatory Visit: Payer: Medicare PPO | Admitting: Cardiology

## 2015-11-21 ENCOUNTER — Other Ambulatory Visit: Payer: Self-pay | Admitting: Cardiology

## 2015-11-22 ENCOUNTER — Other Ambulatory Visit: Payer: Self-pay

## 2015-11-22 MED ORDER — METOPROLOL SUCCINATE ER 50 MG PO TB24: 50.0000 mg | ORAL_TABLET | Freq: Every day | ORAL | Status: DC

## 2015-12-31 ENCOUNTER — Encounter: Payer: Self-pay | Admitting: Cardiology

## 2015-12-31 DIAGNOSIS — R9431 Abnormal electrocardiogram [ECG] [EKG]: Secondary | ICD-10-CM

## 2015-12-31 HISTORY — DX: Abnormal electrocardiogram (ECG) (EKG): R94.31

## 2015-12-31 NOTE — Progress Notes (Signed)
Cardiology Office Note   Date:  01/01/2016   ID:  Michelle Hunt, DOB Nov 10, 1955, MRN [id number]  PCP:  No PCP Per Patient    Chief Complaint  Patient presents with  . Hypertension      History of Present Illness: Michelle Hunt is a 60 y.o. female with a history of HTN who presents today for followup of her BP. She is doing well. She denies any chest pain, SOB, DOE, LE edema, dizziness, palpitations or syncope.    Past Medical History  Diagnosis Date  . Essential hypertension 08/19/2014  . Abnormal EKG 12/31/2015    No past surgical history on file.   Current Outpatient Prescriptions  Medication Sig Dispense Refill  . ABILIFY 15 MG tablet Take 15 mg by mouth daily.  12  . diltiazem (TIAZAC) 300 MG 24 hr capsule TAKE 1 CAPSULE BY MOUTH EVERY DAY 30 capsule 1  . lamoTRIgine (LAMICTAL) 25 MG tablet Take 25 mg by mouth daily.     . metoprolol succinate (TOPROL-XL) 50 MG 24 hr tablet Take 1 tablet (50 mg total) by mouth daily. Take with or immediately following a meal. 30 tablet 1   No current facility-administered medications for this visit.    Allergies:   Azithromycin and Codeine    Social History:  The patient  reports that she has quit smoking. She has never used smokeless tobacco.   Family History:  The patient's family history includes Breast cancer in her mother; Cancer in her father.    ROS:  Please see the history of present illness.   Otherwise, review of systems are positive for none.   All other systems are reviewed and negative.    PHYSICAL EXAM: VS:  BP 154/98 mmHg  Pulse 87  Ht 5\' 5"  (1.651 m)  Wt 286 lb 1.9 oz (129.783 kg)  BMI 47.61 kg/m2 , BMI Body mass index is 47.61 kg/(m^2). GEN: Well nourished, well developed, in no acute distress HEENT: normal Neck: no JVD, carotid bruits, or masses Cardiac: RRR; no murmurs, rubs, or gallops,no edema  Respiratory:  clear to auscultation bilaterally, normal work of  breathing GI: soft, nontender, nondistended, + BS MS: no deformity or atrophy Skin: warm and dry, no rash Neuro:  Strength and sensation are intact Psych: euthymic mood, full affect   EKG:  EKG was ordered today and showed NSR with no ST changes    Recent Labs: No results found for requested labs within last 365 days.    Lipid Panel No results found for: CHOL, TRIG, HDL, CHOLHDL, VLDL, LDLCALC, LDLDIRECT    Wt Readings from Last 3 Encounters:  01/01/16 286 lb 1.9 oz (129.783 kg)  10/04/14 281 lb (127.461 kg)  09/09/14 277 lb (125.646 kg)    ASSESSMENT AND PLAN:  1. HTN borderline controlled today but at home based on the readings she brought in today it is controlled (118-142/75-1790mmHg).  I have encouraged her to get into a routine exercise program to try to lose weight.  - continue CCB and BB 2. Abnormal EKG with normal LVF on echo - EKG normal today 3. Sinus tachycardia - resolved    Current medicines are reviewed at length with the patient today.  The patient does not have concerns regarding medicines.  The following changes have been made:  no change  Labs/ tests ordered today: See above Assessment and Plan No orders of the  defined types were placed in this encounter.     Disposition:   FU with me in 1 year  Signed, Quintella Reichert, MD  01/01/2016 1:38 PM    [facility] [address] [place], [place], [place]  [address] Phone: [phone]; Fax: [phone]

## 2016-01-01 ENCOUNTER — Encounter: Payer: Self-pay | Admitting: Cardiology

## 2016-01-01 ENCOUNTER — Ambulatory Visit (INDEPENDENT_AMBULATORY_CARE_PROVIDER_SITE_OTHER): Payer: Medicare Other | Admitting: Cardiology

## 2016-01-01 VITALS — BP 154/98 | HR 87 | Ht 65.0 in | Wt 286.1 lb

## 2016-01-01 DIAGNOSIS — R9431 Abnormal electrocardiogram [ECG] [EKG]: Secondary | ICD-10-CM

## 2016-01-01 DIAGNOSIS — I10 Essential (primary) hypertension: Secondary | ICD-10-CM

## 2016-01-01 NOTE — Patient Instructions (Signed)

## 2016-01-18 ENCOUNTER — Other Ambulatory Visit: Payer: Self-pay | Admitting: Cardiology

## 2016-02-09 ENCOUNTER — Other Ambulatory Visit: Payer: Self-pay | Admitting: Cardiology

## 2016-03-09 ENCOUNTER — Other Ambulatory Visit: Payer: Self-pay | Admitting: Cardiology

## 2016-06-26 ENCOUNTER — Other Ambulatory Visit: Payer: Self-pay | Admitting: Internal Medicine

## 2016-06-26 DIAGNOSIS — Z1231 Encounter for screening mammogram for malignant neoplasm of breast: Secondary | ICD-10-CM

## 2016-07-25 NOTE — Progress Notes (Signed)
Subjective:    Patient ID: Michelle Hunt, female    DOB: [birth year], 60 y.o.   MRN: [id number]  HPI She is here to establish with a new pcp.   She is here for follow up.  Osteoporosis:  She is a history of osteoporosis and it has been a while since her last bone density. She wonders if she needs to have one. She was on Boniva for 5 years and stopped it because she had been on it for 5 years. She is taking vitamin D 4000 units daily and calcium every day, but does not know how much. She does not exercise. She denies any history of fractures.  GERD: She has GERD symptoms daily for the past 6 months. She takes zantac and it no longer helps - it helped when she first started it. She takes it once a day.   Hypertension: She is taking her medication daily. Her blood pressure was high twice at the dentist office - 160/110.  It was also high at the cardiologist office at her last visit.  She is compliant with a low sodium diet.  She denies chest pain, palpitations, edema, shortness of breath and regular headaches. She is not exercising regularly.  She does not monitor her blood pressure at home - her cuff broke.    She went through menopause in her 20's - she does not remember exactly when.  He had spotting two weeks ago. She wants to see gyn but needs a referral.    Medications and allergies reviewed with patient and updated if appropriate.  Patient Active Problem List   Diagnosis Date Noted  . Post-menopausal bleeding 07/27/2016  . GERD (gastroesophageal reflux disease) 07/26/2016  . Morbid obesity (HCC) 07/26/2016  . Abnormal EKG 12/31/2015  . Essential hypertension 08/19/2014  . VITAMIN D DEFICIENCY 02/23/2010  . GOITER, NODULAR 07/14/2009  . Depression 07/14/2009  . Osteoporosis 07/14/2009    Current Outpatient Prescriptions on File Prior to Visit  Medication Sig Dispense Refill  . ABILIFY 15 MG tablet Take 15 mg by mouth daily.  12  . diltiazem (TIAZAC) 300 MG 24 hr capsule  TAKE 1 CAPSULE BY MOUTH EVERY DAY 30 capsule 9  . lamoTRIgine (LAMICTAL) 25 MG tablet Take 25 mg by mouth daily.     . metoprolol succinate (TOPROL-XL) 50 MG 24 hr tablet TAKE 1 TABLET BY MOUTH EVERY DAY WITH OR IMMEDIATELY FOLLOWING A MEAL 30 tablet 9   No current facility-administered medications on file prior to visit.     Past Medical History:  Diagnosis Date  . Abnormal EKG 12/31/2015  . Essential hypertension 08/19/2014    Past Surgical History:  Procedure Laterality Date  . CHOLECYSTECTOMY    . TUBAL LIGATION      Social History   Social History  . Marital status: Married    Spouse name: N/A  . Number of children: N/A  . Years of education: N/A   Social History Main Topics  . Smoking status: Former Games developer  . Smokeless tobacco: Never Used     Comment: quit 2000  . Alcohol use No  . Drug use: No  . Sexual activity: Not Asked   Other Topics Concern  . None   Social History Narrative  . None    Family History  Problem Relation Age of Onset  . Breast cancer Mother   . Cancer Father     BACK    Review of Systems  Constitutional: Negative for chills and  fever.  Eyes: Negative for visual disturbance.  Respiratory: Negative for cough, shortness of breath and wheezing.   Cardiovascular: Positive for leg swelling (right foot). Negative for chest pain and palpitations.  Gastrointestinal: Negative for abdominal pain, blood in stool, constipation, diarrhea and nausea.  Endocrine: Negative for polydipsia and polyuria.  Genitourinary: Negative for dysuria and hematuria.  Neurological: Negative for dizziness, light-headedness and headaches.       Objective:   Vitals:   07/26/16 1457  BP: 132/88  Pulse: 89  Resp: 18  Temp: 98 F (36.7 C)   Filed Weights   07/26/16 1457  Weight: 292 lb (132.5 kg)   Body mass index is 48.59 kg/m.   Physical Exam Constitutional: Appears well-developed and well-nourished. No distress.  HENT:  Head: Normocephalic and  atraumatic.  Neck: Neck supple. No tracheal deviation present. No thyromegaly present.  No cervical lymphadenopathy Cardiovascular: Normal rate, regular rhythm and normal heart sounds.   No murmur heard. No carotid bruit .  No edema Pulmonary/Chest: Effort normal and breath sounds normal. No respiratory distress. No has no wheezes. No rales.  Abdomen: obese, soft, non tender, non distended Skin: Skin is warm and dry. Not diaphoretic.  Psychiatric: Flat mood and affect. Behavior is normal.         Assessment & Plan:   See Problem List for Assessment and Plan of chronic medical problems.

## 2016-07-25 NOTE — Patient Instructions (Addendum)
  Test(s) ordered today. Your results will be released to MyChart (or called to you) after review, usually within 72hours after test completion. If any changes need to be made, you will be notified at that same time.  All other Health Maintenance issues reviewed.   All recommended immunizations and age-appropriate screenings are up-to-date or discussed.  No immunizations administered today.   Medications reviewed and updated.  Changes include increasing zantac to 150 mg twice daily for your heartburn and starting hydrochlorothiazide 12.5 mg daily for your blood pressure.   Your prescription(s) have been submitted to your pharmacy. Please take as directed and contact our office if you believe you are having problem(s) with the medication(s).  A done density scan was ordered.   A referral was ordered for Gyn.   Please followup in 6 months, sooner if needed

## 2016-07-26 ENCOUNTER — Other Ambulatory Visit (INDEPENDENT_AMBULATORY_CARE_PROVIDER_SITE_OTHER): Payer: Medicare Other

## 2016-07-26 ENCOUNTER — Ambulatory Visit (INDEPENDENT_AMBULATORY_CARE_PROVIDER_SITE_OTHER): Payer: Medicare Other | Admitting: Internal Medicine

## 2016-07-26 ENCOUNTER — Ambulatory Visit: Payer: Medicare Other | Admitting: Internal Medicine

## 2016-07-26 ENCOUNTER — Encounter: Payer: Self-pay | Admitting: Internal Medicine

## 2016-07-26 VITALS — BP 132/88 | HR 89 | Temp 98.0°F | Resp 18 | Ht 65.0 in | Wt 292.0 lb

## 2016-07-26 DIAGNOSIS — N950 Postmenopausal bleeding: Secondary | ICD-10-CM | POA: Diagnosis not present

## 2016-07-26 DIAGNOSIS — Z1159 Encounter for screening for other viral diseases: Secondary | ICD-10-CM

## 2016-07-26 DIAGNOSIS — F329 Major depressive disorder, single episode, unspecified: Secondary | ICD-10-CM

## 2016-07-26 DIAGNOSIS — K219 Gastro-esophageal reflux disease without esophagitis: Secondary | ICD-10-CM | POA: Diagnosis not present

## 2016-07-26 DIAGNOSIS — I10 Essential (primary) hypertension: Secondary | ICD-10-CM

## 2016-07-26 DIAGNOSIS — M810 Age-related osteoporosis without current pathological fracture: Secondary | ICD-10-CM | POA: Diagnosis not present

## 2016-07-26 DIAGNOSIS — F32A Depression, unspecified: Secondary | ICD-10-CM

## 2016-07-26 LAB — COMPREHENSIVE METABOLIC PANEL
ALBUMIN: 4.4 g/dL (ref 3.5–5.2)
ALK PHOS: 93 U/L (ref 39–117)
ALT: 21 U/L (ref 0–35)
AST: 16 U/L (ref 0–37)
BUN: 7 mg/dL (ref 6–23)
CALCIUM: 10.1 mg/dL (ref 8.4–10.5)
CHLORIDE: 98 meq/L (ref 96–112)
CO2: 32 mEq/L (ref 19–32)
Creatinine, Ser: 0.82 mg/dL (ref 0.40–1.20)
GFR: 75.1 mL/min (ref >60.00)
Glucose, Bld: 95 mg/dL (ref 70–99)
POTASSIUM: 3.9 meq/L (ref 3.5–5.1)
Sodium: 137 mEq/L (ref 135–145)
TOTAL PROTEIN: 7.9 g/dL (ref 6.0–8.3)
Total Bilirubin: 0.5 mg/dL (ref 0.2–1.2)

## 2016-07-26 LAB — CBC WITH DIFFERENTIAL/PLATELET
BASOS PCT: 0.4 % (ref 0.0–3.0)
Basophils Absolute: 0 10*3/uL (ref 0.0–0.1)
EOS PCT: 0.5 % (ref 0.0–5.0)
Eosinophils Absolute: 0 10*3/uL (ref 0.0–0.7)
HEMATOCRIT: 48 % — AB (ref 36.0–46.0)
HEMOGLOBIN: 16.1 g/dL — AB (ref 12.0–15.0)
LYMPHS PCT: 27.9 % (ref 12.0–46.0)
Lymphs Abs: 2.3 10*3/uL (ref 0.7–4.0)
MCHC: 33.6 g/dL (ref 30.0–36.0)
MCV: 85.5 fl (ref 78.0–100.0)
MONO ABS: 0.5 10*3/uL (ref 0.1–1.0)
Monocytes Relative: 6.3 % (ref 3.0–12.0)
Neutro Abs: 5.3 10*3/uL (ref 1.4–7.7)
Neutrophils Relative %: 64.9 % (ref 43.0–77.0)
Platelets: 328 10*3/uL (ref 150.0–400.0)
RBC: 5.61 Mil/uL — AB (ref 3.87–5.11)
RDW: 14.4 % (ref 11.5–15.5)
WBC: 8.2 10*3/uL (ref 4.0–10.5)

## 2016-07-26 LAB — TSH: TSH: 1.05 u[IU]/mL (ref 0.35–4.50)

## 2016-07-26 LAB — HEMOGLOBIN A1C: HEMOGLOBIN A1C: 5.5 % (ref 4.6–6.5)

## 2016-07-26 MED ORDER — HYDROCHLOROTHIAZIDE 12.5 MG PO TABS: 12.5000 mg | ORAL_TABLET | Freq: Every day | ORAL | 3 refills | Status: DC

## 2016-07-26 MED ORDER — VITAMIN D (CHOLECALCIFEROL) 25 MCG (1000 UT) PO TABS: 4000.0000 [IU] | ORAL_TABLET | Freq: Every day | ORAL | Status: AC

## 2016-07-26 MED ORDER — CALCIUM CARBONATE 600 MG PO TABS: 600.0000 mg | ORAL_TABLET | Freq: Two times a day (BID) | ORAL | Status: AC

## 2016-07-26 MED ORDER — RANITIDINE HCL 150 MG PO TABS: 150.0000 mg | ORAL_TABLET | Freq: Two times a day (BID) | ORAL | 5 refills | Status: DC

## 2016-07-26 NOTE — Assessment & Plan Note (Addendum)
dexa ordered Continue calcium and vitamin d-instructed 600 mg of calcium twice daily with food Stressed regular exercise Will consider treatment if still osteoporotic

## 2016-07-26 NOTE — Progress Notes (Signed)
Pre visit review using our clinic review tool, if applicable. No additional management support is needed unless otherwise documented below in the visit note. 

## 2016-07-26 NOTE — Assessment & Plan Note (Addendum)
Taking zantac once a day - initially worked, but no longer working Will increase to 150 mg BID --  If not effective will need to start omeprazole Work on weight loss

## 2016-07-26 NOTE — Assessment & Plan Note (Signed)
Blood pressure has been variable and not ideally controlled Start hydrochlorothiazide 12.5 mg daily Continue current dose of metoprolol and diltiazem She will start monitoring at home cmp

## 2016-07-26 NOTE — Assessment & Plan Note (Signed)
Encouraged weight loss Stressed regular exericxse

## 2016-07-27 ENCOUNTER — Encounter: Payer: Self-pay | Admitting: Internal Medicine

## 2016-07-27 DIAGNOSIS — N950 Postmenopausal bleeding: Secondary | ICD-10-CM | POA: Insufficient documentation

## 2016-07-27 LAB — HEPATITIS C ANTIBODY: HCV AB: NEGATIVE

## 2016-07-27 NOTE — Assessment & Plan Note (Signed)
Referred to gyn

## 2016-07-27 NOTE — Assessment & Plan Note (Signed)
Management per psychiatry 

## 2016-07-29 ENCOUNTER — Telehealth: Payer: Self-pay | Admitting: Emergency Medicine

## 2016-07-29 NOTE — Telephone Encounter (Signed)
Please enter referral

## 2016-07-29 NOTE — Telephone Encounter (Signed)
Referral was ordered at her visit.   Ok to start cologuard process

## 2016-07-29 NOTE — Telephone Encounter (Signed)
Spoke with pt to inform.  

## 2016-07-29 NOTE — Telephone Encounter (Signed)
Pt called and is spotting. She wants to go see a gynecologist but needs a referral. She prefers to see Dr Luberta MutterMaydew with [facility]. Phone # is [phone] and the fax # is [phone]. Pt also wanted to let you know her colonguard is covered by insurance. Please advise thanks.

## 2016-07-31 ENCOUNTER — Ambulatory Visit
Admission: RE | Admit: 2016-07-31 | Discharge: 2016-07-31 | Disposition: A | Discharge status: Home / Self Care | Payer: Medicare Other | Source: Ambulatory Visit | Attending: Internal Medicine | Admitting: Internal Medicine

## 2016-07-31 ENCOUNTER — Ambulatory Visit (INDEPENDENT_AMBULATORY_CARE_PROVIDER_SITE_OTHER)
Admission: RE | Admit: 2016-07-31 | Discharge: 2016-07-31 | Disposition: A | Discharge status: Home / Self Care | Payer: Medicare Other | Source: Ambulatory Visit | Attending: Internal Medicine | Admitting: Internal Medicine

## 2016-07-31 DIAGNOSIS — M810 Age-related osteoporosis without current pathological fracture: Secondary | ICD-10-CM

## 2016-07-31 DIAGNOSIS — Z1231 Encounter for screening mammogram for malignant neoplasm of breast: Secondary | ICD-10-CM

## 2016-08-01 LAB — COLOGUARD: COLOGUARD: NEGATIVE

## 2016-08-01 NOTE — Telephone Encounter (Signed)
Cologuard test has been ordered.

## 2016-08-04 ENCOUNTER — Other Ambulatory Visit: Payer: Self-pay | Admitting: Internal Medicine

## 2016-08-04 DIAGNOSIS — M810 Age-related osteoporosis without current pathological fracture: Secondary | ICD-10-CM

## 2016-08-04 MED ORDER — ALENDRONATE SODIUM 70 MG PO TABS: 70.0000 mg | ORAL_TABLET | ORAL | 11 refills | Status: DC

## 2016-08-21 ENCOUNTER — Telehealth: Payer: Self-pay | Admitting: Emergency Medicine

## 2016-08-21 NOTE — Telephone Encounter (Signed)
Pt called to inform that she is about to have teeth pull and the label on the Fosamax states that she needs to contact her doctor if she is having teeth pulled. Please advise if pt should stop until after procedure is performed.

## 2016-08-21 NOTE — Telephone Encounter (Signed)
Spoke with pt to inform.  

## 2016-08-21 NOTE — Telephone Encounter (Signed)
Yes, hold fosamax now and restart when healing is complete ( ask dentist)

## 2016-10-09 ENCOUNTER — Encounter: Payer: Self-pay | Admitting: Internal Medicine

## 2017-01-06 ENCOUNTER — Other Ambulatory Visit: Payer: Self-pay | Admitting: Cardiology

## 2017-01-13 ENCOUNTER — Encounter: Payer: Self-pay | Admitting: Cardiology

## 2017-01-29 ENCOUNTER — Ambulatory Visit (INDEPENDENT_AMBULATORY_CARE_PROVIDER_SITE_OTHER): Payer: Medicare Other | Admitting: Cardiology

## 2017-01-29 ENCOUNTER — Encounter: Payer: Self-pay | Admitting: Internal Medicine

## 2017-01-29 ENCOUNTER — Ambulatory Visit (INDEPENDENT_AMBULATORY_CARE_PROVIDER_SITE_OTHER): Payer: Medicare Other | Admitting: Internal Medicine

## 2017-01-29 ENCOUNTER — Encounter: Payer: Self-pay | Admitting: Cardiology

## 2017-01-29 ENCOUNTER — Encounter (INDEPENDENT_AMBULATORY_CARE_PROVIDER_SITE_OTHER): Payer: Self-pay

## 2017-01-29 VITALS — BP 158/80 | HR 80 | Ht 65.0 in | Wt 265.0 lb

## 2017-01-29 VITALS — BP 122/84 | HR 85 | Temp 97.9°F | Resp 16 | Wt 265.0 lb

## 2017-01-29 DIAGNOSIS — I10 Essential (primary) hypertension: Secondary | ICD-10-CM | POA: Diagnosis not present

## 2017-01-29 DIAGNOSIS — K219 Gastro-esophageal reflux disease without esophagitis: Secondary | ICD-10-CM | POA: Diagnosis not present

## 2017-01-29 DIAGNOSIS — E049 Nontoxic goiter, unspecified: Secondary | ICD-10-CM | POA: Diagnosis not present

## 2017-01-29 DIAGNOSIS — M810 Age-related osteoporosis without current pathological fracture: Secondary | ICD-10-CM

## 2017-01-29 MED ORDER — METOPROLOL SUCCINATE ER 50 MG PO TB24: ORAL_TABLET | ORAL | 3 refills | Status: DC

## 2017-01-29 MED ORDER — DILTIAZEM HCL ER BEADS 300 MG PO CP24: ORAL_CAPSULE | ORAL | 3 refills | Status: DC

## 2017-01-29 NOTE — Patient Instructions (Signed)
Medication Instructions:  1) INCREASE TOPROL to 75 mg daily  Labwork: None  Testing/Procedures: None  Follow-Up: Your physician recommends that you schedule a follow-up appointment AS NEEDED with Dr. Mayford Knife.  Any Other Special Instructions Will Be Listed Below (If Applicable).     If you need a refill on your cardiac medications before your next appointment, please call your pharmacy.

## 2017-01-29 NOTE — Assessment & Plan Note (Signed)
toprol increased today with cardiology Continue other medications at current doses

## 2017-01-29 NOTE — Assessment & Plan Note (Signed)
GERD controlled Continue daily medication  

## 2017-01-29 NOTE — Assessment & Plan Note (Signed)
dexa up to date Taking cal/vitamin d exercising

## 2017-01-29 NOTE — Progress Notes (Signed)
Cardiology Office Note    Date:  01/29/2017   ID:  Michelle Hunt, [name] December 06, 1955, MRN [id number]  PCP:  Pincus Sanes, MD  Cardiologist:  Armanda Magic, MD   Chief Complaint  Patient presents with  . Hypertension    History of Present Illness:  Michelle Hunt is a 61 y.o. female  with a history of HTN who presents today for followup of her BP. She is doing well. She denies any chest pain, SOB, DOE, PND, orthopnea, dizziness, palpitations or syncope.   Past Medical History:  Diagnosis Date  . Abnormal EKG 12/31/2015   EKG 01/2017 normal  . Essential hypertension 08/19/2014    Past Surgical History:  Procedure Laterality Date  . CHOLECYSTECTOMY    . TUBAL LIGATION      Current Medications: Current Meds  Medication Sig  . ABILIFY 15 MG tablet Take 15 mg by mouth daily.  Marland Kitchen alendronate (FOSAMAX) 70 MG tablet Take 1 tablet (70 mg total) by mouth every 7 (seven) days. Take with a full glass of water on an empty stomach.  . ALPRAZolam (XANAX) 0.5 MG tablet Take 0.5 mg by mouth at bedtime as needed for anxiety.  . calcium carbonate (CALCIUM 600) 600 MG TABS tablet Take 1 tablet (600 mg total) by mouth 2 (two) times daily with a meal.  . diltiazem (TIAZAC) 300 MG 24 hr capsule TAKE 1 CAPSULE BY MOUTH EVERY DAY  . hydrochlorothiazide (HYDRODIURIL) 12.5 MG tablet Take 1 tablet (12.5 mg total) by mouth daily.  Marland Kitchen lamoTRIgine (LAMICTAL) 25 MG tablet Take 25 mg by mouth daily.   . metoprolol succinate (TOPROL-XL) 50 MG 24 hr tablet TAKE 1 TABLET BY MOUTH EVERY DAY WITH OR IMMEDIATELY FOLLOWING A MEAL  . ranitidine (ZANTAC) 150 MG tablet Take 1 tablet (150 mg total) by mouth 2 (two) times daily.  . Vitamin D, Cholecalciferol, 1000 units TABS Take 4,000 Units by mouth daily.    Allergies:   Azithromycin and Codeine   Social History   Social History  . Marital status: Married    Spouse name: N/A  . Number of children: N/A  . Years of education: N/A   Social History  Main Topics  . Smoking status: Former Games developer  . Smokeless tobacco: Never Used     Comment: quit 2000  . Alcohol use No  . Drug use: No  . Sexual activity: Not Asked   Other Topics Concern  . None   Social History Narrative  . None     Family History:  The patient's family history includes Breast cancer in her mother; Cancer in her father.   ROS:   Please see the history of present illness.    ROS All other systems reviewed and are negative.  No flowsheet data found.     PHYSICAL EXAM:   VS:  BP (!) 158/80   Pulse 80   Ht  (1.651 m)   Wt 265 lb (120.2 kg)   BMI 44.10 kg/m    GEN: Well nourished, well developed, in no acute distress  HEENT: normal  Neck: no JVD, carotid bruits, or masses Cardiac: RRR; no murmurs, rubs, or gallops,no edema.  Intact distal pulses bilaterally.  Respiratory:  clear to auscultation bilaterally, normal work of breathing GI: soft, nontender, nondistended, + BS MS: no deformity or atrophy  Skin: warm and dry, no rash Neuro:  Alert and Oriented x 3, Strength and sensation are intact Psych: euthymic mood, full affect  Wt  Readings from Last 3 Encounters:  01/29/17 265 lb (120.2 kg)  07/26/16 292 lb (132.5 kg)  01/01/16 286 lb 1.9 oz (129.8 kg)      Studies/Labs Reviewed:   EKG:  EKG is ordered today.  The ekg ordered today demonstrates NSR at 79bpm with no ST changes  Recent Labs: 07/26/2016: ALT 21; BUN 7; Creatinine, Ser 0.82; Hemoglobin 16.1; Platelets 328.0; Potassium 3.9; Sodium 137; TSH 1.05   Lipid Panel No results found for: CHOL, TRIG, HDL, CHOLHDL, VLDL, LDLCALC, LDLDIRECT  Additional studies/ records that were reviewed today include:  none    ASSESSMENT:    1. Essential hypertension      PLAN:  In order of problems listed above:  1. HTN - her BP is elevated today on exam.  She currently takes Cardizem  daily, HCTZ 12.5mg  daily and Toprol XL  daily.  She does not check her BP at home but her PCP  has been adjusting her meds.  SHe has an appt with her PCP today for followup of her HTN.  I have recommended that she see only one provider to manage her BP.  I will recommend increasing her Toprol to  daily and then her PCP can follow this from here on out and I will see her as needed.    Medication Adjustments/Labs and Tests Ordered: Current medicines are reviewed at length with the patient today.  Concerns regarding medicines are outlined above.  Medication changes, Labs and Tests ordered today are listed in the Patient Instructions below.  There are no Patient Instructions on file for this visit.   Signed, Armanda Magic, MD  01/29/2017 11:28 AM    [facility] [address] [place], [place], [place]  [address] Phone: [phone]; Fax: [phone]

## 2017-01-29 NOTE — Progress Notes (Signed)
Pre visit review using our clinic review tool, if applicable. No additional management support is needed unless otherwise documented below in the visit note. 

## 2017-01-29 NOTE — Assessment & Plan Note (Signed)
Continue regular exercise Continue to work on weight loss

## 2017-01-29 NOTE — Progress Notes (Signed)
Subjective:    Patient ID: Michelle Hunt, female    DOB: 09/29/1956, 61 y.o.   MRN: [id number]  HPI The patient is here for follow up.  ? How long on medicare?  Hypertension: She is taking her medication daily. She is compliant with a low sodium diet.  She denies chest pain, palpitations, shortness of breath and regular headaches. She is exercising regularly - walking on treadmill 3/week.  She does monitor her blood pressure at home, but not regularly.    GERD:  She is taking her medication daily as prescribed.  She denies any GERD symptoms and feels her GERD is well controlled.   Osteoporosis:  She is taking calcium and vitamin d.  She has been exercising 3/week.    Goiter:  She had a biopsy in 2004.  She denies changes in the thyroid.    Her right ankle swelling just on the lateral side of the joint.  It is there constantly.  Her other ankle is good.   She has lost weight and continues to work on weight loss.    Medications and allergies reviewed with patient and updated if appropriate.  Patient Active Problem List   Diagnosis Date Noted  . Post-menopausal bleeding 07/27/2016  . GERD (gastroesophageal reflux disease) 07/26/2016  . Morbid obesity (HCC) 07/26/2016  . Abnormal EKG 12/31/2015  . Essential hypertension 08/19/2014  . VITAMIN D DEFICIENCY 02/23/2010  . 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 Depression 07/14/2009  . Osteoporosis 07/14/2009    Current Outpatient Prescriptions on File Prior to Visit  Medication Sig Dispense Refill  . ABILIFY 15 MG tablet Take 15 mg by mouth daily.  12  . alendronate (FOSAMAX) 70 MG tablet Take 1 tablet (70 mg total) by mouth every 7 (seven) days. Take with a full glass of water on an empty stomach. 4 tablet 11  . ALPRAZolam (XANAX) 0.5 MG tablet Take 0.5 mg by mouth at bedtime as needed for anxiety.    . calcium carbonate (CALCIUM 600) 600 MG TABS tablet Take 1 tablet (600 mg total) by mouth 2 (two) times daily with a meal.  60 tablet   . diltiazem (TIAZAC) 300 MG 24 hr capsule TAKE 1 CAPSULE BY MOUTH EVERY DAY 30 capsule 0  . hydrochlorothiazide (HYDRODIURIL) 12.5 MG tablet Take 1 tablet (12.5 mg total) by mouth daily. 90 tablet 3  . lamoTRIgine (LAMICTAL) 25 MG tablet Take 25 mg by mouth daily.     . ranitidine (ZANTAC) 150 MG tablet Take 1 tablet (150 mg total) by mouth 2 (two) times daily. 60 tablet 5  . Vitamin D, Cholecalciferol, 1000 units TABS Take 4,000 Units by mouth daily. 60 tablet    No current facility-administered medications on file prior to visit.     Past Medical History:  Diagnosis Date  . Abnormal EKG 12/31/2015   EKG 01/2017 normal  . Essential hypertension 08/19/2014    Past Surgical History:  Procedure Laterality Date  . CHOLECYSTECTOMY    . TUBAL LIGATION      Social History   Social History  . Marital status: Married    Spouse name: N/A  . Number of children: N/A  . Years of education: N/A   Social History Main Topics  . Smoking status: Former Games developer  . Smokeless tobacco: Never Used     Comment: quit 2000  . Alcohol use No  . Drug use: No  . Sexual activity: Not on file   Other Topics Concern  .  Not on file   Social History Narrative  . No narrative on file    Family History  Problem Relation Age of Onset  . Breast cancer Mother   . Cancer Father     BACK    Review of Systems  Constitutional: Negative for chills and fever.  Respiratory: Negative for cough, shortness of breath and wheezing.   Cardiovascular: Positive for leg swelling. Negative for chest pain and palpitations.  Endocrine: Positive for cold intolerance.  Neurological: Negative for light-headedness and headaches.       Objective:   Vitals:   01/29/17 1307  BP: 122/84  Pulse: 85  Resp: 16  Temp: 97.9 F (36.6 C)   Wt Readings from Last 3 Encounters:  01/29/17 265 lb (120.2 kg)  01/29/17 265 lb (120.2 kg)  07/26/16 292 lb (132.5 kg)   Body mass index is 44.1 kg/m.    Physical Exam    Constitutional: Appears well-developed and well-nourished. No distress.  HENT:  Head: Normocephalic and atraumatic.  Neck: Neck supple. No tracheal deviation present. No thyromegaly present.  No cervical lymphadenopathy Cardiovascular: Normal rate, regular rhythm and normal heart sounds.   No murmur heard. No carotid bruit .  No edema; bursa right lateral ankle - no edema Pulmonary/Chest: Effort normal and breath sounds normal. No respiratory distress. No has no wheezes. No rales.  Skin: Skin is warm and dry. Not diaphoretic.  Psychiatric: Normal mood and affect. Behavior is normal.      Assessment & Plan:    See Problem List for Assessment and Plan of chronic medical problems.   FU in 6 months

## 2017-01-29 NOTE — Patient Instructions (Addendum)
  All other Health Maintenance issues reviewed.   All recommended immunizations and age-appropriate screenings are up-to-date or discussed.  No immunizations administered today.   Medications reviewed and updated.  No changes recommended at this time.  Your prescription(s) have been submitted to your pharmacy. Please take as directed and contact our office if you believe you are having problem(s) with the medication(s).  An ultrasound of your thyroid was ordered   Please followup in 6 moths

## 2017-01-29 NOTE — Assessment & Plan Note (Signed)
Negative biopsy 2204 No change per patient [place] ordered - if stable may not need additional imaging

## 2017-02-10 ENCOUNTER — Ambulatory Visit
Admission: RE | Admit: 2017-02-10 | Discharge: 2017-02-10 | Disposition: A | Discharge status: Home / Self Care | Payer: Medicare Other | Source: Ambulatory Visit | Attending: Internal Medicine | Admitting: Internal Medicine

## 2017-02-10 DIAGNOSIS — E049 Nontoxic goiter, unspecified: Secondary | ICD-10-CM

## 2017-03-17 ENCOUNTER — Other Ambulatory Visit: Payer: Self-pay | Admitting: Internal Medicine

## 2017-06-23 ENCOUNTER — Other Ambulatory Visit: Payer: Self-pay | Admitting: Internal Medicine

## 2017-06-23 DIAGNOSIS — Z1239 Encounter for other screening for malignant neoplasm of breast: Secondary | ICD-10-CM

## 2017-07-04 ENCOUNTER — Other Ambulatory Visit: Payer: Self-pay | Admitting: Internal Medicine

## 2017-07-30 ENCOUNTER — Ambulatory Visit (INDEPENDENT_AMBULATORY_CARE_PROVIDER_SITE_OTHER): Payer: Medicare Other | Admitting: Internal Medicine

## 2017-07-30 ENCOUNTER — Other Ambulatory Visit: Payer: Medicare Other

## 2017-07-30 ENCOUNTER — Encounter: Payer: Self-pay | Admitting: Internal Medicine

## 2017-07-30 ENCOUNTER — Other Ambulatory Visit (INDEPENDENT_AMBULATORY_CARE_PROVIDER_SITE_OTHER): Payer: Medicare Other

## 2017-07-30 VITALS — BP 124/86 | HR 87 | Temp 98.6°F | Resp 16 | Wt 283.0 lb

## 2017-07-30 DIAGNOSIS — F32A Depression, unspecified: Secondary | ICD-10-CM

## 2017-07-30 DIAGNOSIS — I10 Essential (primary) hypertension: Secondary | ICD-10-CM | POA: Diagnosis not present

## 2017-07-30 DIAGNOSIS — R0781 Pleurodynia: Secondary | ICD-10-CM

## 2017-07-30 DIAGNOSIS — E559 Vitamin D deficiency, unspecified: Secondary | ICD-10-CM | POA: Diagnosis not present

## 2017-07-30 DIAGNOSIS — F329 Major depressive disorder, single episode, unspecified: Secondary | ICD-10-CM | POA: Diagnosis not present

## 2017-07-30 DIAGNOSIS — M810 Age-related osteoporosis without current pathological fracture: Secondary | ICD-10-CM

## 2017-07-30 DIAGNOSIS — G8929 Other chronic pain: Secondary | ICD-10-CM

## 2017-07-30 DIAGNOSIS — M549 Dorsalgia, unspecified: Secondary | ICD-10-CM

## 2017-07-30 DIAGNOSIS — K219 Gastro-esophageal reflux disease without esophagitis: Secondary | ICD-10-CM

## 2017-07-30 DIAGNOSIS — F419 Anxiety disorder, unspecified: Secondary | ICD-10-CM

## 2017-07-30 LAB — CBC WITH DIFFERENTIAL/PLATELET
BASOS ABS: 0 10*3/uL (ref 0.0–0.1)
Basophils Relative: 0.9 % (ref 0.0–3.0)
EOS ABS: 0 10*3/uL (ref 0.0–0.7)
Eosinophils Relative: 0.7 % (ref 0.0–5.0)
HCT: 46.3 % — ABNORMAL HIGH (ref 36.0–46.0)
Hemoglobin: 15.3 g/dL — ABNORMAL HIGH (ref 12.0–15.0)
Lymphocytes Relative: 33.3 % (ref 12.0–46.0)
Lymphs Abs: 1.6 10*3/uL (ref 0.7–4.0)
MCHC: 33 g/dL (ref 30.0–36.0)
MCV: 87.9 fl (ref 78.0–100.0)
Monocytes Absolute: 0.4 10*3/uL (ref 0.1–1.0)
Monocytes Relative: 7.7 % (ref 3.0–12.0)
NEUTROS ABS: 2.8 10*3/uL (ref 1.4–7.7)
NEUTROS PCT: 57.4 % (ref 43.0–77.0)
Platelets: 292 10*3/uL (ref 150.0–400.0)
RBC: 5.27 Mil/uL — AB (ref 3.87–5.11)
RDW: 14.2 % (ref 11.5–15.5)
WBC: 4.9 10*3/uL (ref 4.0–10.5)

## 2017-07-30 LAB — COMPREHENSIVE METABOLIC PANEL
ALT: 14 U/L (ref 0–35)
AST: 16 U/L (ref 0–37)
Albumin: 4.3 g/dL (ref 3.5–5.2)
Alkaline Phosphatase: 63 U/L (ref 39–117)
BILIRUBIN TOTAL: 0.6 mg/dL (ref 0.2–1.2)
BUN: 9 mg/dL (ref 6–23)
CO2: 30 meq/L (ref 19–32)
CREATININE: 0.86 mg/dL (ref 0.40–1.20)
Calcium: 9.8 mg/dL (ref 8.4–10.5)
Chloride: 97 mEq/L (ref 96–112)
GFR: 70.85 mL/min (ref >60.00)
GLUCOSE: 97 mg/dL (ref 70–99)
Potassium: 3.9 mEq/L (ref 3.5–5.1)
SODIUM: 137 meq/L (ref 135–145)
TOTAL PROTEIN: 7.5 g/dL (ref 6.0–8.3)

## 2017-07-30 LAB — LIPID PANEL
CHOLESTEROL: 197 mg/dL (ref 0–200)
HDL: 63.9 mg/dL (ref >39.00)
LDL CALC: 110 mg/dL — AB (ref 0–99)
NonHDL: 133.34
Total CHOL/HDL Ratio: 3
Triglycerides: 117 mg/dL (ref 0.0–149.0)
VLDL: 23.4 mg/dL (ref 0.0–40.0)

## 2017-07-30 LAB — VITAMIN D 25 HYDROXY (VIT D DEFICIENCY, FRACTURES): VITD: 51.7 ng/mL (ref 30.00–100.00)

## 2017-07-30 LAB — TSH: TSH: 1.31 u[IU]/mL (ref 0.35–4.50)

## 2017-07-30 NOTE — Assessment & Plan Note (Signed)
For years she has had pain across her upper back It has never been evaluated Discussed possible x-rays and referral to sports medicine-she deferred Continue symptomatic treatment Advised her to let me know if she wants this evaluated further

## 2017-07-30 NOTE — Progress Notes (Signed)
Subjective:    Patient ID: Michelle Hunt, female    DOB: 1955-11-29, 61 y.o.   MRN: [id number]  HPI The patient is here for follow up.  Pain left lateral ribs:  It has been hurting for the past week.  She denies injuries.  She does sleep on that side and did wake up with the pain and wondered if she did something in her sleep.  Pain is worse with movement.  She denies any increased pain with deep breaths.  She is not experiencing any cough, shortness of breath, wheezing or fevers.  Back pain:  She has chronic upper back pain - it started years ago.  She states the pain is across her upper back.  Sometimes she will not have pain.  She is unsure what makes it better or worse.  She has never had it evaluated.  Hypertension: She is taking her medication daily. She is compliant with a low sodium diet.  She denies chest pain, palpitations, edema, shortness of breath and regular headaches. She is not exercising regularly.  She does not monitor her blood pressure at home.    Osteoporosis:  She is taking vitamin d daily - 4000 units a day and calcium 600mg  twice a day.  She is taking the Fosamax weekly as prescribed.  She did have to stop it for a short period of time when she had some of her teeth removed.  She denies side effects.  Depression, anxiety: She is following with psychiatry.  She is taking her medication daily as prescribed. She denies any side effects from the medication. She feels her depression and anxiety are well controlled and she is happy with her current dose of medication.   GERD:  She is taking her medication daily as prescribed.  She denies any GERD symptoms and feels her GERD is well controlled.    Medications and allergies reviewed with patient and updated if appropriate.  Patient Active Problem List   Diagnosis Date Noted  . Anxiety 07/30/2017  . Post-menopausal bleeding 07/27/2016  . GERD (gastroesophageal reflux disease) 07/26/2016  . Morbid obesity (HCC)  07/26/2016  . Abnormal EKG 12/31/2015  . Essential hypertension 08/19/2014  . VITAMIN D DEFICIENCY 02/23/2010  . 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 Depression 07/14/2009  . Osteoporosis 07/14/2009    Current Outpatient Prescriptions on File Prior to Visit  Medication Sig Dispense Refill  . ABILIFY 15 MG tablet Take 15 mg by mouth daily.  12  . alendronate (FOSAMAX) 70 MG tablet Take 1 tablet (70 mg total) by mouth every 7 (seven) days. Take with a full glass of water on an empty stomach. 4 tablet 11  . ALPRAZolam (XANAX) 0.5 MG tablet Take 0.5 mg by mouth at bedtime as needed for anxiety.    . calcium carbonate (CALCIUM 600) 600 MG TABS tablet Take 1 tablet (600 mg total) by mouth 2 (two) times daily with a meal. 60 tablet   . diltiazem (TIAZAC) 300 MG 24 hr capsule TAKE 1 CAPSULE BY MOUTH EVERY DAY 90 capsule 3  . hydrochlorothiazide (HYDRODIURIL) 12.5 MG tablet TAKE 1 TABLET(12.5 MG) BY MOUTH DAILY 90 tablet 0  . lamoTRIgine (LAMICTAL) 25 MG tablet Take 25 mg by mouth daily.     . metoprolol succinate (TOPROL-XL) 50 MG 24 hr tablet Take 75 mg (1.5 tablets) daily. 135 tablet 3  . ranitidine (ZANTAC) 150 MG tablet TAKE 1 TABLET(150 MG) BY MOUTH TWICE DAILY 60 tablet 5  . Vitamin D,  Cholecalciferol, 1000 units TABS Take 4,000 Units by mouth daily. 60 tablet    No current facility-administered medications on file prior to visit.     Past Medical History:  Diagnosis Date  . Abnormal EKG 12/31/2015   EKG 01/2017 normal  . Essential hypertension 08/19/2014    Past Surgical History:  Procedure Laterality Date  . CHOLECYSTECTOMY    . TUBAL LIGATION      Social History   Social History  . Marital status: Married    Spouse name: N/A  . Number of children: N/A  . Years of education: N/A   Social History Main Topics  . Smoking status: Former Games developer  . Smokeless tobacco: Never Used     Comment: quit 2000  . Alcohol use No  . Drug use: No  . Sexual activity: Not on file    Other Topics Concern  . Not on file   Social History Narrative  . No narrative on file    Family History  Problem Relation Age of Onset  . Breast cancer Mother   . Cancer Father        BACK    Review of Systems  Constitutional: Negative for chills and fever.  Respiratory: Negative for cough, shortness of breath and wheezing.   Cardiovascular: Negative for chest pain, palpitations and leg swelling.  Neurological: Negative for light-headedness, numbness and headaches.       Objective:   Vitals:   07/30/17 1114  BP: 124/86  Pulse: 87  Resp: 16  Temp: 98.6 F (37 C)  SpO2: 96%   Wt Readings from Last 3 Encounters:  07/30/17 283 lb (128.4 kg)  01/29/17 265 lb (120.2 kg)  01/29/17 265 lb (120.2 kg)   Body mass index is 47.09 kg/m.   Physical Exam    Constitutional: Appears well-developed and well-nourished. No distress.  HENT:  Head: Normocephalic and atraumatic.  Neck: Neck supple. No tracheal deviation present. No thyromegaly present.  No cervical lymphadenopathy Cardiovascular: Normal rate, regular rhythm and normal heart sounds.   No murmur heard. No carotid bruit .  No edema Pulmonary/Chest: Effort normal and breath sounds normal. No respiratory distress. No has no wheezes. No rales.  Musculoskeletal: Minimal tenderness left lateral-anterior ribs, no deformity; no thoracic spine tenderness, no tenderness across upper back currently Skin: Skin is warm and dry. Not diaphoretic.  Psychiatric: Normal mood and affect. Behavior is normal.      Assessment & Plan:    See Problem List for Assessment and Plan of chronic medical problems.  Follow-up in 6 months

## 2017-07-30 NOTE — Assessment & Plan Note (Signed)
Started 1 week ago without injury She believes it started while she was sleeping and she does sleep on that side and wonders if that was the cause Likely muscular skeletal in nature Given lack of trauma it is unlikely that she fractured anything We will hold off on imaging Discussed possible referral to sports medicine, she deferred Call or return if no improvement

## 2017-07-30 NOTE — Assessment & Plan Note (Signed)
GERD controlled Continue daily medication  

## 2017-07-30 NOTE — Assessment & Plan Note (Signed)
Management per psychiatry She feels her depression is well controlled

## 2017-07-30 NOTE — Patient Instructions (Addendum)
  Test(s) ordered today. Your results will be released to MyChart (or called to you) after review, usually within 72hours after test completion. If any changes need to be made, you will be notified at that same time.  No immunizations administered today.   Medications reviewed and updated.  No changes recommended at this time.  Please followup in 6 months   

## 2017-07-30 NOTE — Assessment & Plan Note (Signed)
Has been on Fosamax for for 1 year since 08/2016-no side effects Continue Fosamax Continue calcium 600 mg twice daily Taking vitamin D-we will check level She is currently not exercising-encouraged regular exercise Recheck bone density 07/2018

## 2017-07-30 NOTE — Assessment & Plan Note (Signed)
BP well controlled Current regimen effective and well tolerated Continue current medications at current doses cmp  

## 2017-07-30 NOTE — Assessment & Plan Note (Signed)
Taking vitamin D daily Has osteoporosis Will check vitamin D level 

## 2017-07-30 NOTE — Assessment & Plan Note (Signed)
Management per psychiatry She feels her anxiety is well controlled and is happy with her medication

## 2017-08-07 ENCOUNTER — Ambulatory Visit: Payer: Medicare Other

## 2017-08-31 ENCOUNTER — Other Ambulatory Visit: Payer: Self-pay | Admitting: Internal Medicine

## 2017-09-01 ENCOUNTER — Ambulatory Visit: Payer: Medicare Other

## 2017-09-11 ENCOUNTER — Ambulatory Visit
Admission: RE | Admit: 2017-09-11 | Discharge: 2017-09-11 | Disposition: A | Discharge status: Home / Self Care | Payer: Medicare Other | Source: Ambulatory Visit | Attending: Internal Medicine | Admitting: Internal Medicine

## 2017-09-11 DIAGNOSIS — Z1239 Encounter for other screening for malignant neoplasm of breast: Secondary | ICD-10-CM

## 2017-09-30 ENCOUNTER — Other Ambulatory Visit: Payer: Self-pay | Admitting: Internal Medicine

## 2017-10-01 ENCOUNTER — Other Ambulatory Visit: Payer: Self-pay | Admitting: Internal Medicine

## 2018-01-25 ENCOUNTER — Other Ambulatory Visit: Payer: Self-pay | Admitting: Cardiology

## 2018-01-26 ENCOUNTER — Other Ambulatory Visit: Payer: Self-pay | Admitting: Internal Medicine

## 2018-01-28 ENCOUNTER — Ambulatory Visit: Payer: Medicare Other | Admitting: Internal Medicine

## 2018-02-15 NOTE — Patient Instructions (Addendum)
Have blood work done in the next 1-2 months.   Test(s) ordered today. Your results will be released to MyChart (or called to you) after review, usually within 72hours after test completion. If any changes need to be made, you will be notified at that same time.   Medications reviewed and updated.  Changes include increasing the hydrochlorothiazide to 25 mg daily.   Your prescription(s) have been submitted to your pharmacy. Please take as directed and contact our office if you believe you are having problem(s) with the medication(s).   Please followup in 6 months

## 2018-02-15 NOTE — Progress Notes (Signed)
Subjective:    Patient ID: Michelle Hunt, female    DOB: 1956/09/23, 62 y.o.   MRN: [id number]  HPI The patient is here for follow up.  Bump in the back of her right ear.  Her daughter noticed it 6 months ago.  It does not hurt and has not gotten larger.   Bilateral feet swelling:  It started about two weeks ago.  She denies any changes in medications, diet.  She did stand more over a period of three days and that is when the swelling started.  It did get better, but is not normal.  She is compliant with a low sodium diet.  She does not exercise.  She sometimes elevates her legs when sitting.  He continues to gain weight in [place] to being on Abilify.  Left rib or side pain;  When she bends over it feels like rib bends under and it hurts and it goes away when she stands straight up.  She does not have any pain with standing or sitting.  Hypertension: She is taking her medication daily. She is compliant with a low sodium diet.  She has had some shortness of breath since she was here last.  She is not exercising regularly.  She has gained weight-almost 20 pounds in the past 6 months.  She denies chest pain, palpitations and regular headaches.    GERD:  She is taking her medication daily as prescribed.  She denies any GERD symptoms and feels her GERD is well controlled.   Osteoporosis:  She is taking fosamax - it was started 08/2016.  She is taking calcium and vitamin d daily.  She is not exercising.    Medications and allergies reviewed with patient and updated if appropriate.  Patient Active Problem List   Diagnosis Date Noted  . Anxiety 07/30/2017  . Rib pain on left side 07/30/2017  . Chronic upper back pain 07/30/2017  . GERD (gastroesophageal reflux disease) 07/26/2016  . Morbid obesity (HCC) 07/26/2016  . Abnormal EKG 12/31/2015  . Essential hypertension 08/19/2014  . Vitamin D deficiency 02/23/2010  . 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 Depression 07/14/2009  .  Osteoporosis 07/14/2009    Current Outpatient Medications on File Prior to Visit  Medication Sig Dispense Refill  . ABILIFY 15 MG tablet Take 15 mg by mouth daily.  12  . alendronate (FOSAMAX) 70 MG tablet TAKE 1 TABLET(70 MG) BY MOUTH EVERY 7 DAYS WITH A FULL GLASS OF WATER AND ON AN EMPTY STOMACH 4 tablet 11  . ALPRAZolam (XANAX) 0.5 MG tablet Take 0.5 mg by mouth at bedtime as needed for anxiety.    . calcium carbonate (CALCIUM 600) 600 MG TABS tablet Take 1 tablet (600 mg total) by mouth 2 (two) times daily with a meal. 60 tablet   . diltiazem (TIAZAC) 300 MG 24 hr capsule TAKE 1 CAPSULE BY MOUTH EVERY DAY 90 capsule 0  . hydrochlorothiazide (HYDRODIURIL) 12.5 MG tablet TAKE 1 TABLET(12.5 MG) BY MOUTH DAILY 90 tablet 1  . lamoTRIgine (LAMICTAL) 25 MG tablet Take 25 mg by mouth daily.     . metoprolol succinate (TOPROL-XL) 50 MG 24 hr tablet TAKE 1 AND 1/2 TABLETS(75 MG) BY MOUTH DAILY 135 tablet 2  . ranitidine (ZANTAC) 150 MG tablet TAKE 1 TABLET(150 MG) BY MOUTH TWICE DAILY 180 tablet 1  . Vitamin D, Cholecalciferol, 1000 units TABS Take 4,000 Units by mouth daily. 60 tablet    No current facility-administered medications on file  prior to visit.     Past Medical History:  Diagnosis Date  . Abnormal EKG 12/31/2015   EKG 01/2017 normal  . Essential hypertension 08/19/2014    Past Surgical History:  Procedure Laterality Date  . CHOLECYSTECTOMY    . TUBAL LIGATION      Social History   Socioeconomic History  . Marital status: Married    Spouse name: Not on file  . Number of children: Not on file  . Years of education: Not on file  . Highest education level: Not on file  Occupational History  . Not on file  Social Needs  . Financial resource strain: Not on file  . Food insecurity:    Worry: Not on file    Inability: Not on file  . Transportation needs:    Medical: Not on file    Non-medical: Not on file  Tobacco Use  . Smoking status: Former Games developer  . Smokeless  tobacco: Never Used  . Tobacco comment: quit 2000  Substance and Sexual Activity  . Alcohol use: No    Alcohol/week: 0.0 oz  . Drug use: No  . Sexual activity: Not on file  Lifestyle  . Physical activity:    Days per week: Not on file    Minutes per session: Not on file  . Stress: Not on file  Relationships  . Social connections:    Talks on phone: Not on file    Gets together: Not on file    Attends religious service: Not on file    Active member of club or organization: Not on file    Attends meetings of clubs or organizations: Not on file    Relationship status: Not on file  Other Topics Concern  . Not on file  Social History Narrative  . Not on file    Family History  Problem Relation Age of Onset  . Breast cancer Mother   . Cancer Father        BACK  . Kidney cancer Father     Review of Systems  Constitutional: Negative for chills and fever.  Respiratory: Positive for shortness of breath (new). Negative for cough and wheezing.   Cardiovascular: Positive for leg swelling (feet). Negative for chest pain and palpitations.  Gastrointestinal: Negative for abdominal pain and nausea.  Neurological: Negative for light-headedness and headaches.       Objective:   Vitals:   02/17/18 1106  BP: 128/82  Pulse: 87  Resp: 18  Temp: 97.7 F (36.5 C)  SpO2: 96%   BP Readings from Last 3 Encounters:  02/17/18 128/82  07/30/17 124/86  01/29/17 122/84   Wt Readings from Last 3 Encounters:  02/17/18 (!) 301 lb (136.5 kg)  07/30/17 283 lb (128.4 kg)  01/29/17 265 lb (120.2 kg)   Body mass index is 50.09 kg/m.   Physical Exam    Constitutional: Appears well-developed and well-nourished. No distress.  HENT:  Head: Normocephalic and atraumatic.  Neck: Neck supple. No tracheal deviation present. No thyromegaly present.  No cervical lymphadenopathy Cardiovascular: Normal rate, regular rhythm and normal heart sounds.   No murmur heard. No carotid bruit .  1+  nonpitting bilateral lower extremity edema Pulmonary/Chest: Effort normal and breath sounds normal. No respiratory distress. No has no wheezes. No rales.  Skin: Skin is warm and dry. Not diaphoretic.  Epidermoid cyst right neck behind ear Psychiatric: Normal mood and affect. Behavior is normal.      Assessment & Plan:    See  Problem List for Assessment and Plan of chronic medical problems.

## 2018-02-17 ENCOUNTER — Ambulatory Visit: Payer: Medicare Other | Admitting: Internal Medicine

## 2018-02-17 ENCOUNTER — Encounter: Payer: Self-pay | Admitting: Internal Medicine

## 2018-02-17 VITALS — BP 128/82 | HR 87 | Temp 97.7°F | Resp 18 | Wt 301.0 lb

## 2018-02-17 DIAGNOSIS — L720 Epidermal cyst: Secondary | ICD-10-CM | POA: Insufficient documentation

## 2018-02-17 DIAGNOSIS — M810 Age-related osteoporosis without current pathological fracture: Secondary | ICD-10-CM

## 2018-02-17 DIAGNOSIS — I10 Essential (primary) hypertension: Secondary | ICD-10-CM | POA: Diagnosis not present

## 2018-02-17 DIAGNOSIS — M7989 Other specified soft tissue disorders: Secondary | ICD-10-CM | POA: Diagnosis not present

## 2018-02-17 DIAGNOSIS — R0602 Shortness of breath: Secondary | ICD-10-CM | POA: Diagnosis not present

## 2018-02-17 DIAGNOSIS — R0600 Dyspnea, unspecified: Secondary | ICD-10-CM | POA: Insufficient documentation

## 2018-02-17 DIAGNOSIS — R0781 Pleurodynia: Secondary | ICD-10-CM | POA: Diagnosis not present

## 2018-02-17 DIAGNOSIS — K219 Gastro-esophageal reflux disease without esophagitis: Secondary | ICD-10-CM

## 2018-02-17 DIAGNOSIS — R0789 Other chest pain: Secondary | ICD-10-CM

## 2018-02-17 MED ORDER — HYDROCHLOROTHIAZIDE 25 MG PO TABS: 25.0000 mg | ORAL_TABLET | Freq: Every day | ORAL | 3 refills | Status: DC

## 2018-02-17 NOTE — Assessment & Plan Note (Signed)
Blood pressure well controlled We will increase hydrochlorothiazide to 25 mg daily for leg swelling Continue other medications at current dose She will come back for CMP in the next month or so

## 2018-02-17 NOTE — Assessment & Plan Note (Signed)
Started 2 weeks ago after standing for long periods of time 3 days in a row Swelling has improved, but not back to normal No changes in medication or diet Taking hydrochlorothiazide 12.5 mg daily Advised elevating legs, increasing exercise and working on weight loss Continue low-sodium diet Will increase hydrochlorothiazide to 25 mg daily CMP in the next month

## 2018-02-17 NOTE — Assessment & Plan Note (Signed)
The bump is consistent with an epidermoid cyst Discussed that this is benign Can see dermatology for removal if she would like

## 2018-02-17 NOTE — Assessment & Plan Note (Signed)
Discussed consequences of her weight and the importance of her trying to get her weight down She feels most of her weight gain over the past several years is related to the Abilify and she has discussed coming off the medication with her psychiatrist.  The Abilify is the only medication that has helped so there is some hesitance Discussed the importance of exercise and revising her diet Discussed the weight management clinic

## 2018-02-17 NOTE — Assessment & Plan Note (Signed)
Pain is musculoskeletal in nature and only occurs when she bends over Pain goes away with standing Discussed that this is benign Weight loss may help

## 2018-02-17 NOTE — Assessment & Plan Note (Signed)
She has been experiencing some shortness of breath with exertion Likely related to morbid obesity and being sedentary She has gained almost 20 pounds in the past 6 months-feels most of this is related to being on Abilify Echocardiogram 2015 showed EF 55-60%.  Less likely heart failure. She is considering coming off of the Abilify Discussed the importance of exercising and doing more with lifestyle to help combat the weight gain May need further cardiac evaluation of shortness of breath does not improve or worsens

## 2018-02-17 NOTE — Assessment & Plan Note (Signed)
Taking calcium and vitamin D Taking Fosamax since 08/2016 Not currently exercising No GERD Continue Fosamax for at least 5 years

## 2018-02-17 NOTE — Assessment & Plan Note (Signed)
GERD controlled Continue daily medication  

## 2018-04-24 ENCOUNTER — Other Ambulatory Visit: Payer: Self-pay | Admitting: Internal Medicine

## 2018-05-26 ENCOUNTER — Other Ambulatory Visit (INDEPENDENT_AMBULATORY_CARE_PROVIDER_SITE_OTHER): Payer: Medicare Other

## 2018-05-26 DIAGNOSIS — I10 Essential (primary) hypertension: Secondary | ICD-10-CM

## 2018-05-26 DIAGNOSIS — M7989 Other specified soft tissue disorders: Secondary | ICD-10-CM | POA: Diagnosis not present

## 2018-05-26 LAB — COMPREHENSIVE METABOLIC PANEL
ALT: 17 U/L (ref 0–35)
AST: 19 U/L (ref 0–37)
Albumin: 4.2 g/dL (ref 3.5–5.2)
Alkaline Phosphatase: 71 U/L (ref 39–117)
BILIRUBIN TOTAL: 0.7 mg/dL (ref 0.2–1.2)
BUN: 8 mg/dL (ref 6–23)
CHLORIDE: 95 meq/L — AB (ref 96–112)
CO2: 29 meq/L (ref 19–32)
CREATININE: 0.83 mg/dL (ref 0.40–1.20)
Calcium: 9.6 mg/dL (ref 8.4–10.5)
GFR: 73.62 mL/min (ref >60.00)
GLUCOSE: 93 mg/dL (ref 70–99)
Potassium: 3.2 mEq/L — ABNORMAL LOW (ref 3.5–5.1)
SODIUM: 134 meq/L — AB (ref 135–145)
Total Protein: 7.5 g/dL (ref 6.0–8.3)

## 2018-05-28 ENCOUNTER — Other Ambulatory Visit: Payer: Self-pay | Admitting: Internal Medicine

## 2018-05-28 MED ORDER — POTASSIUM CHLORIDE CRYS ER 20 MEQ PO TBCR: 20.0000 meq | EXTENDED_RELEASE_TABLET | Freq: Every day | ORAL | 5 refills | Status: DC

## 2018-07-17 ENCOUNTER — Telehealth: Payer: Self-pay | Admitting: Internal Medicine

## 2018-07-17 MED ORDER — FAMOTIDINE 40 MG PO TABS: 40.0000 mg | ORAL_TABLET | Freq: Two times a day (BID) | ORAL | 1 refills | Status: DC

## 2018-07-17 NOTE — Telephone Encounter (Signed)
Copied from CRM [id number]. Topic: Quick Communication - Rx Refill/Question >> Jul 17, 2018  8:21 AM Maia Petties wrote: Medication: ranitidine (ZANTAC) 150 MG tablet  - pt states medication has been taken off the market and is needing another medication to be sent in for indigestion  Has the patient contacted their pharmacy? Yes Preferred Pharmacy (with phone number or street name): [id number] - [place], [place] - [address] [phone] (Phone) [phone] (Fax)

## 2018-07-17 NOTE — Telephone Encounter (Signed)
Pepcid sent to pharmacy  

## 2018-08-03 ENCOUNTER — Other Ambulatory Visit: Payer: Self-pay | Admitting: Internal Medicine

## 2018-08-31 NOTE — Progress Notes (Addendum)
Subjective:   Michelle Hunt is a 62 y.o. female who presents for an Initial Medicare Annual Wellness Visit.  Review of Systems    No ROS.  Medicare Wellness Visit. Additional risk factors are reflected in the social history.   Cardiac Risk Factors include: advanced age (>5155men, 48>65 women);hypertension;obesity (BMI >30kg/m2);sedentary lifestyle Sleep patterns: gets up 1-2 times nightly to void and sleeps 6-7 hours nightly. Sleep interrupted due to pain.   Home Safety/Smoke Alarms: Feels safe in home. Smoke alarms in place.  Living environment; residence and Firearm Safety: 1-story house/ trailer, no firearms. Lives with husband, no needs for DME, good support system Seat Belt Safety/Bike Helmet: Wears seat belt.      Objective:    Today's Vitals   09/01/18 1316 09/01/18 1413  BP: (!) 148/88   Pulse: 88   SpO2: 98%   Weight: (!) 304 lb (137.9 kg)   Height: 5\' 5"  (1.651 m)   PainSc:  4    Body mass index is 50.59 kg/m.  Advanced Directives 09/01/2018  Does Patient Have a Medical Advance Directive? No  Would patient like information on creating a medical advance directive? Yes (ED - Information included in AVS)    Current Medications (verified) Outpatient Encounter Medications as of 09/01/2018  Medication Sig  . ABILIFY 15 MG tablet Take 15 mg by mouth daily.  Marland Kitchen. ALPRAZolam (XANAX) 0.5 MG tablet Take 0.5 mg by mouth at bedtime as needed for anxiety.  . calcium carbonate (CALCIUM 600) 600 MG TABS tablet Take 1 tablet (600 mg total) by mouth 2 (two) times daily with a meal.  . diltiazem (TIAZAC) 300 MG 24 hr capsule TAKE ONE CAPSULE BY MOUTH EVERY DAY  . famotidine (PEPCID) 40 MG tablet Take 1 tablet (40 mg total) by mouth daily.  . hydrochlorothiazide (HYDRODIURIL) 25 MG tablet Take 1 tablet (25 mg total) by mouth daily.  Marland Kitchen. lamoTRIgine (LAMICTAL) 25 MG tablet Take 25 mg by mouth daily.   . metoprolol succinate (TOPROL-XL) 50 MG 24 hr tablet TAKE 1 AND 1/2 TABLETS(75  MG) BY MOUTH DAILY  . omeprazole (PRILOSEC) 20 MG capsule Take 1 capsule (20 mg total) by mouth daily. Take 30 minutes prior to a meal  . potassium chloride SA (K-DUR,KLOR-CON) 20 MEQ tablet Take 1 tablet (20 mEq total) by mouth daily.  . Vitamin D, Cholecalciferol, 1000 units TABS Take 4,000 Units by mouth daily.  . [DISCONTINUED] alendronate (FOSAMAX) 70 MG tablet TAKE 1 TABLET(70 MG) BY MOUTH EVERY 7 DAYS WITH A FULL GLASS OF WATER AND ON AN EMPTY STOMACH  . [DISCONTINUED] famotidine (PEPCID) 40 MG tablet Take 1 tablet (40 mg total) by mouth 2 (two) times daily.   No facility-administered encounter medications on file as of 09/01/2018.     Allergies (verified) Azithromycin and Codeine   History: Past Medical History:  Diagnosis Date  . Abnormal EKG 12/31/2015   EKG 01/2017 normal  . Essential hypertension 08/19/2014   Past Surgical History:  Procedure Laterality Date  . CHOLECYSTECTOMY    . TUBAL LIGATION     Family History  Problem Relation Age of Onset  . Breast cancer Mother   . Cancer Father        BACK  . Kidney cancer Father    Social History   Socioeconomic History  . Marital status: Married    Spouse name: Not on file  . Number of children: 2  . Years of education: Not on file  . Highest education level:  Not on file  Occupational History  . Not on file  Social Needs  . Financial resource strain: Not hard at all  . Food insecurity:    Worry: Never true    Inability: Never true  . Transportation needs:    Medical: No    Non-medical: No  Tobacco Use  . Smoking status: Former Games developer  . Smokeless tobacco: Never Used  . Tobacco comment: quit 2000  Substance and Sexual Activity  . Alcohol use: No    Alcohol/week: 0.0 standard drinks  . Drug use: No  . Sexual activity: Not Currently  Lifestyle  . Physical activity:    Days per week: 0 days    Minutes per session: 0 min  . Stress: Only a little  Relationships  . Social connections:    Talks on phone:  More than three times a week    Gets together: More than three times a week    Attends religious service: 1 to 4 times per year    Active member of club or organization: Yes    Attends meetings of clubs or organizations: More than 4 times per year    Relationship status: Married  Other Topics Concern  . Not on file  Social History Narrative  . Not on file    Tobacco Counseling Counseling given: Not Answered Comment: quit 2000  Activities of Daily Living In your present state of health, do you have any difficulty performing the following activities: 09/01/2018  Hearing? N  Vision? N  Difficulty concentrating or making decisions? N  Walking or climbing stairs? N  Dressing or bathing? N  Doing errands, shopping? N  Preparing Food and eating ? N  Using the Toilet? N  In the past six months, have you accidently leaked urine? N  Do you have problems with loss of bowel control? N  Managing your Medications? N  Managing your Finances? N  Housekeeping or managing your Housekeeping? N  Some recent data might be hidden     Immunizations and Health Maintenance  There is no immunization history on file for this patient. Health Maintenance Due  Topic Date Due  . PAP SMEAR  08/21/1975  . DEXA SCAN  07/31/2018    Patient Care Team: Pincus Sanes, MD as PCP - General (Internal Medicine)  Indicate any recent Medical Services you may have received from other than [facility] providers in the past year (date may be approximate).     Assessment:   This is a routine wellness examination for [name]. Physical assessment deferred to PCP.    Hearing/Vision screen  Visual Acuity Screening   Right eye Left eye Both eyes  Without correction:   20/25  With correction:     Comments: Patient states she will make an eye appointment with an ophthalmologist in the [place] area. Education provided regarding annual eye exams and eye health.  Hearing Screening Comments: Able to hear  conversational tones w/o difficulty. No issues reported.  Passed whisper test  Dietary issues and exercise activities discussed: Current Exercise Habits: The patient does not participate in regular exercise at present([employer] exercise print-out provided), Exercise limited by: orthopedic condition(s)(obesity)  Diet (meal preparation, eat out, water intake, caffeinated beverages, dairy products, fruits and vegetables): in general, a "healthy" diet     Reviewed heart healthy and diabetic diet. Discussed weight loss diet and weight loss strategies. Relevant patient education assigned to patient using Emmi.  Goals   None    Depression Screen PHQ 2/9 Scores  09/01/2018 07/30/2017  PHQ - 2 Score 0 0  PHQ- 9 Score 0 -    Fall Risk Fall Risk  09/01/2018 07/30/2017  Falls in the past year? 0 No   Cognitive Function:       Ad8 score reviewed for issues:  Issues making decisions: no  Less interest in hobbies / activities: no  Repeats questions, stories (family complaining): no  Trouble using ordinary gadgets (microwave, computer, phone):no  Forgets the month or year: no  Mismanaging finances: no  Remembering appts: no  Daily problems with thinking and/or memory: no Ad8 score is= 0  Screening Tests Health Maintenance  Topic Date Due  . PAP SMEAR  08/21/1975  . DEXA SCAN  07/31/2018  . INFLUENZA VACCINE  02/04/2019 (Originally 05/07/2018)  . TETANUS/TDAP  02/18/2019 (Originally 08/20/1973)  . Fecal DNA (Cologuard)  08/02/2019  . MAMMOGRAM  09/12/2019  . Hepatitis C Screening  Completed  . HIV Screening  Completed     Plan:     Continue doing brain stimulating activities (puzzles, reading, adult coloring books, staying active) to keep memory sharp.   Continue to eat heart healthy diet (full of fruits, vegetables, whole grains, lean protein, water--limit salt, fat, and sugar intake) and increase physical activity as tolerated.  I have personally reviewed and noted the  following in the patient's chart:   . Medical and social history . Use of alcohol, tobacco or illicit drugs  . Current medications and supplements . Functional ability and status . Nutritional status . Physical activity . Advanced directives . List of other physicians . Vitals . Screenings to include cognitive, depression, and falls . Referrals and appointments  In addition, I have reviewed and discussed with patient certain preventive protocols, quality metrics, and best practice recommendations. A written personalized care plan for preventive services as well as general preventive health recommendations were provided to patient.     Wanda Plump, RN   09/01/2018    Medical screening examination/treatment/procedure(s) were performed by non-physician practitioner and as supervising physician I was immediately available for consultation/collaboration. I agree with above. Pincus Sanes, MD

## 2018-08-31 NOTE — Progress Notes (Signed)
Subjective:    Patient ID: Michelle MainsChristine M Hunt, female    DOB: 08-04-1956, 62 y.o.   MRN: [id number]  HPI The patient is here for follow up.  Dry mouth:   It started 4 days. She denies changes in meds or supplements.  She drinks a lot of water during the day and there has been no changes.  She denies increased urination.    Hypertension: She is taking her medication daily. She is compliant with a low sodium diet.  She denies chest pain, palpitations, shortness of breath and regular headaches. She is not exercising regularly.     GERD:  She is taking her medication daily as prescribed.  She has GERD symptoms nightly.  It does not matter what she eats and typically she eats early in the evening.  OP:  She is taking fosamax daily since 10/2015.  She takes calcium and vitamin d daily.  She is not exercising.    Morbid obesity:  She is not exercising.  She eats too many carbs.  She does not eat too many sweets.    Medications and allergies reviewed with patient and updated if appropriate.  Patient Active Problem List   Diagnosis Date Noted  . Prediabetes 09/01/2018  . Leg swelling 02/17/2018  . Dyspnea 02/17/2018  . Epidermoid cyst of neck 02/17/2018  . Anxiety 07/30/2017  . Rib pain on left side 07/30/2017  . Chronic upper back pain 07/30/2017  . GERD (gastroesophageal reflux disease) 07/26/2016  . Morbid obesity (HCC) 07/26/2016  . Abnormal EKG 12/31/2015  . Essential hypertension 08/19/2014  . Vitamin D deficiency 02/23/2010  . 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 Depression 07/14/2009  . Osteoporosis 07/14/2009    Current Outpatient Medications on File Prior to Visit  Medication Sig Dispense Refill  . ABILIFY 15 MG tablet Take 15 mg by mouth daily.  12  . ALPRAZolam (XANAX) 0.5 MG tablet Take 0.5 mg by mouth at bedtime as needed for anxiety.    . calcium carbonate (CALCIUM 600) 600 MG TABS tablet Take 1 tablet (600 mg total) by mouth 2 (two) times daily with a meal. 60  tablet   . diltiazem (TIAZAC) 300 MG 24 hr capsule TAKE ONE CAPSULE BY MOUTH EVERY DAY 90 capsule 0  . hydrochlorothiazide (HYDRODIURIL) 25 MG tablet Take 1 tablet (25 mg total) by mouth daily. 90 tablet 3  . lamoTRIgine (LAMICTAL) 25 MG tablet Take 25 mg by mouth daily.     . metoprolol succinate (TOPROL-XL) 50 MG 24 hr tablet TAKE 1 AND 1/2 TABLETS(75 MG) BY MOUTH DAILY 135 tablet 2  . potassium chloride SA (K-DUR,KLOR-CON) 20 MEQ tablet Take 1 tablet (20 mEq total) by mouth daily. 30 tablet 5  . Vitamin D, Cholecalciferol, 1000 units TABS Take 4,000 Units by mouth daily. 60 tablet    No current facility-administered medications on file prior to visit.     Past Medical History:  Diagnosis Date  . Abnormal EKG 12/31/2015   EKG 01/2017 normal  . Essential hypertension 08/19/2014    Past Surgical History:  Procedure Laterality Date  . CHOLECYSTECTOMY    . TUBAL LIGATION      Social History   Socioeconomic History  . Marital status: Married    Spouse name: Not on file  . Number of children: 2  . Years of education: Not on file  . Highest education level: Not on file  Occupational History  . Not on file  Social Needs  . Financial  resource strain: Not hard at all  . Food insecurity:    Worry: Never true    Inability: Never true  . Transportation needs:    Medical: No    Non-medical: No  Tobacco Use  . Smoking status: Former Games developer  . Smokeless tobacco: Never Used  . Tobacco comment: quit 2000  Substance and Sexual Activity  . Alcohol use: No    Alcohol/week: 0.0 standard drinks  . Drug use: No  . Sexual activity: Not Currently  Lifestyle  . Physical activity:    Days per week: 0 days    Minutes per session: 0 min  . Stress: Only a little  Relationships  . Social connections:    Talks on phone: More than three times a week    Gets together: More than three times a week    Attends religious service: 1 to 4 times per year    Active member of club or organization:  Yes    Attends meetings of clubs or organizations: More than 4 times per year    Relationship status: Married  Other Topics Concern  . Not on file  Social History Narrative  . Not on file    Family History  Problem Relation Age of Onset  . Breast cancer Mother   . Cancer Father        BACK  . Kidney cancer Father     Review of Systems  Constitutional: Negative for chills and fever.  Respiratory: Negative for cough, shortness of breath and wheezing.   Cardiovascular: Positive for leg swelling (chronic, mild). Negative for chest pain and palpitations.  Gastrointestinal: Negative for abdominal pain and nausea.       GERD not controlled  Neurological: Negative for light-headedness and headaches.       Objective:   Vitals:   09/01/18 1316  BP: (!) 148/88  Pulse: 88  Resp: 18  Temp: 98 F (36.7 C)  SpO2: 98%   BP Readings from Last 3 Encounters:  09/01/18 (!) 148/88  09/01/18 (!) 148/88  02/17/18 128/82   Wt Readings from Last 3 Encounters:  09/01/18 (!) 304 lb (137.9 kg)  09/01/18 (!) 304 lb 1.9 oz (137.9 kg)  02/17/18 (!) 301 lb (136.5 kg)   Body mass index is 50.61 kg/m.   Physical Exam    Constitutional: Appears well-developed and well-nourished. No distress.  HENT:  Head: Normocephalic and atraumatic.  Neck: Neck supple. No tracheal deviation present. No thyromegaly present.  No cervical lymphadenopathy Cardiovascular: Normal rate, regular rhythm and normal heart sounds.   No murmur heard. No carotid bruit .  Mild b/l LE edema Pulmonary/Chest: Effort normal and breath sounds normal. No respiratory distress. No has no wheezes. No rales.  Skin: Skin is warm and dry. Not diaphoretic.  Psychiatric: Normal mood and affect. Behavior is normal.      Assessment & Plan:    See Problem List for Assessment and Plan of chronic medical problems.

## 2018-08-31 NOTE — Patient Instructions (Addendum)
  Tests ordered today. Your results will be released to MyChart (or called to you) after review, usually within 72hours after test completion. If any changes need to be made, you will be notified at that same time.   Medications reviewed and updated.  Changes include :   Start omeprazole 20 mg daily - take 30 minutes prior to breakfast.  Continue the pepcid in the evening.   Your prescription(s) have been submitted to your pharmacy. Please take as directed and contact our office if you believe you are having problem(s) with the medication(s).   Please followup in 6 months

## 2018-09-01 ENCOUNTER — Ambulatory Visit: Payer: Medicare Other | Admitting: Internal Medicine

## 2018-09-01 ENCOUNTER — Other Ambulatory Visit (INDEPENDENT_AMBULATORY_CARE_PROVIDER_SITE_OTHER): Payer: Medicare Other

## 2018-09-01 ENCOUNTER — Encounter: Payer: Self-pay | Admitting: Internal Medicine

## 2018-09-01 ENCOUNTER — Ambulatory Visit (INDEPENDENT_AMBULATORY_CARE_PROVIDER_SITE_OTHER): Payer: Medicare Other | Admitting: *Deleted

## 2018-09-01 VITALS — BP 148/88 | HR 88 | Temp 98.0°F | Resp 18 | Ht 65.0 in | Wt 304.1 lb

## 2018-09-01 VITALS — BP 148/88 | HR 88 | Ht 65.0 in | Wt 304.0 lb

## 2018-09-01 DIAGNOSIS — F329 Major depressive disorder, single episode, unspecified: Secondary | ICD-10-CM

## 2018-09-01 DIAGNOSIS — I10 Essential (primary) hypertension: Secondary | ICD-10-CM

## 2018-09-01 DIAGNOSIS — M810 Age-related osteoporosis without current pathological fracture: Secondary | ICD-10-CM

## 2018-09-01 DIAGNOSIS — R7303 Prediabetes: Secondary | ICD-10-CM

## 2018-09-01 DIAGNOSIS — F419 Anxiety disorder, unspecified: Secondary | ICD-10-CM

## 2018-09-01 DIAGNOSIS — F32A Depression, unspecified: Secondary | ICD-10-CM

## 2018-09-01 DIAGNOSIS — K219 Gastro-esophageal reflux disease without esophagitis: Secondary | ICD-10-CM

## 2018-09-01 DIAGNOSIS — Z0000 Encounter for general adult medical examination without abnormal findings: Secondary | ICD-10-CM

## 2018-09-01 LAB — CBC WITH DIFFERENTIAL/PLATELET
Basophils Absolute: 0.1 10*3/uL (ref 0.0–0.1)
Basophils Relative: 1.7 % (ref 0.0–3.0)
EOS ABS: 0 10*3/uL (ref 0.0–0.7)
Eosinophils Relative: 0.6 % (ref 0.0–5.0)
HCT: 45.7 % (ref 36.0–46.0)
Hemoglobin: 15.1 g/dL — ABNORMAL HIGH (ref 12.0–15.0)
LYMPHS ABS: 2 10*3/uL (ref 0.7–4.0)
LYMPHS PCT: 32.9 % (ref 12.0–46.0)
MCHC: 33.1 g/dL (ref 30.0–36.0)
MCV: 84.5 fl (ref 78.0–100.0)
MONO ABS: 0.5 10*3/uL (ref 0.1–1.0)
Monocytes Relative: 7.8 % (ref 3.0–12.0)
NEUTROS PCT: 57 % (ref 43.0–77.0)
Neutro Abs: 3.5 10*3/uL (ref 1.4–7.7)
Platelets: 304 10*3/uL (ref 150.0–400.0)
RBC: 5.4 Mil/uL — AB (ref 3.87–5.11)
RDW: 15.1 % (ref 11.5–15.5)
WBC: 6.1 10*3/uL (ref 4.0–10.5)

## 2018-09-01 LAB — COMPREHENSIVE METABOLIC PANEL
ALBUMIN: 4.3 g/dL (ref 3.5–5.2)
ALK PHOS: 73 U/L (ref 39–117)
ALT: 18 U/L (ref 0–35)
AST: 18 U/L (ref 0–37)
BUN: 8 mg/dL (ref 6–23)
CALCIUM: 9.8 mg/dL (ref 8.4–10.5)
CHLORIDE: 95 meq/L — AB (ref 96–112)
CO2: 30 mEq/L (ref 19–32)
Creatinine, Ser: 0.83 mg/dL (ref 0.40–1.20)
GFR: 73.55 mL/min (ref >60.00)
Glucose, Bld: 98 mg/dL (ref 70–99)
POTASSIUM: 3.5 meq/L (ref 3.5–5.1)
Sodium: 134 mEq/L — ABNORMAL LOW (ref 135–145)
TOTAL PROTEIN: 7.7 g/dL (ref 6.0–8.3)
Total Bilirubin: 0.6 mg/dL (ref 0.2–1.2)

## 2018-09-01 LAB — LIPID PANEL
CHOLESTEROL: 180 mg/dL (ref 0–200)
HDL: 62.7 mg/dL (ref >39.00)
LDL CALC: 103 mg/dL — AB (ref 0–99)
NonHDL: 117.49
TRIGLYCERIDES: 74 mg/dL (ref 0.0–149.0)
Total CHOL/HDL Ratio: 3
VLDL: 14.8 mg/dL (ref 0.0–40.0)

## 2018-09-01 LAB — HEMOGLOBIN A1C: Hgb A1c MFr Bld: 5.6 % (ref 4.6–6.5)

## 2018-09-01 MED ORDER — FAMOTIDINE 40 MG PO TABS: 40.0000 mg | ORAL_TABLET | Freq: Every day | ORAL | 1 refills | Status: AC

## 2018-09-01 MED ORDER — ALENDRONATE SODIUM 70 MG PO TABS: 70.0000 mg | ORAL_TABLET | ORAL | 3 refills | Status: AC

## 2018-09-01 MED ORDER — OMEPRAZOLE 20 MG PO CPDR: 20.0000 mg | DELAYED_RELEASE_CAPSULE | Freq: Every day | ORAL | 3 refills | Status: DC

## 2018-09-01 NOTE — Assessment & Plan Note (Signed)
Management per psychiatry 

## 2018-09-01 NOTE — Assessment & Plan Note (Signed)
Not controlled - having nightly GERD Start omeprazole 20 mg before breakfast Continue pepcid in evening

## 2018-09-01 NOTE — Assessment & Plan Note (Signed)
Blood pressure slightly elevated here today, but was very aggravated just before coming into the office Blood pressure typically well controlled We will continue current medications at current doses CMP

## 2018-09-01 NOTE — Assessment & Plan Note (Signed)
Taking Fosamax weekly-started 08/2016-continue Continue calcium and vitamin D daily Not exercising-encouraged regular exercise

## 2018-09-01 NOTE — Assessment & Plan Note (Signed)
Not exercising, eating too many carbs Stressed weight loss - increase exercise and decrease carbs Deferred nutrition referral F/u in 6 months

## 2018-09-01 NOTE — Assessment & Plan Note (Signed)
a1c 5.7% in the past  Check a1c Low sugar / carb diet Stressed regular exercise, weight loss

## 2018-09-10 ENCOUNTER — Ambulatory Visit: Payer: Self-pay

## 2018-09-10 NOTE — Progress Notes (Signed)
Subjective:    Patient ID: Michelle Hunt, female    DOB: 17-Nov-1955, 62 y.o.   MRN: [id number]  HPI She is here for an acute visit for cold symptoms.  Her symptoms started 3 days.  Her grandson has been sick.    She is experiencing sore throat, dry cough, wheezing this morning, nausea, vomiting from coughing, and little bit of diarrhea. She denies fever, chills and SOB.   She has taken Tylenol and cough drops  Medications and allergies reviewed with patient and updated if appropriate.  Patient Active Problem List   Diagnosis Date Noted  . Prediabetes 09/01/2018  . Leg swelling 02/17/2018  . Dyspnea 02/17/2018  . Epidermoid cyst of neck 02/17/2018  . Anxiety 07/30/2017  . Rib pain on left side 07/30/2017  . Chronic upper back pain 07/30/2017  . GERD (gastroesophageal reflux disease) 07/26/2016  . Morbid obesity (HCC) 07/26/2016  . Abnormal EKG 12/31/2015  . Essential hypertension 08/19/2014  . Vitamin D deficiency 02/23/2010  . 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 Depression 07/14/2009  . Osteoporosis 07/14/2009    Current Outpatient Medications on File Prior to Visit  Medication Sig Dispense Refill  . ABILIFY 15 MG tablet Take 15 mg by mouth daily.  12  . alendronate (FOSAMAX) 70 MG tablet Take 1 tablet (70 mg total) by mouth once a week. Take with a full glass of water on an empty stomach. 12 tablet 3  . ALPRAZolam (XANAX) 0.5 MG tablet Take 0.5 mg by mouth at bedtime as needed for anxiety.    . calcium carbonate (CALCIUM 600) 600 MG TABS tablet Take 1 tablet (600 mg total) by mouth 2 (two) times daily with a meal. 60 tablet   . diltiazem (TIAZAC) 300 MG 24 hr capsule TAKE ONE CAPSULE BY MOUTH EVERY DAY 90 capsule 0  . famotidine (PEPCID) 40 MG tablet Take 1 tablet (40 mg total) by mouth daily. 90 tablet 1  . hydrochlorothiazide (HYDRODIURIL) 25 MG tablet Take 1 tablet (25 mg total) by mouth daily. 90 tablet 3  . lamoTRIgine (LAMICTAL) 25 MG tablet Take 25 mg  by mouth daily.     . metoprolol succinate (TOPROL-XL) 50 MG 24 hr tablet TAKE 1 AND 1/2 TABLETS(75 MG) BY MOUTH DAILY 135 tablet 2  . omeprazole (PRILOSEC) 20 MG capsule Take 1 capsule (20 mg total) by mouth daily. Take 30 minutes prior to a meal 30 capsule 3  . potassium chloride SA (K-DUR,KLOR-CON) 20 MEQ tablet Take 1 tablet (20 mEq total) by mouth daily. 30 tablet 5  . Vitamin D, Cholecalciferol, 1000 units TABS Take 4,000 Units by mouth daily. 60 tablet    No current facility-administered medications on file prior to visit.     Past Medical History:  Diagnosis Date  . Abnormal EKG 12/31/2015   EKG 01/2017 normal  . Essential hypertension 08/19/2014    Past Surgical History:  Procedure Laterality Date  . CHOLECYSTECTOMY    . TUBAL LIGATION      Social History   Socioeconomic History  . Marital status: Married    Spouse name: Not on file  . Number of children: 2  . Years of education: Not on file  . Highest education level: Not on file  Occupational History  . Not on file  Social Needs  . Financial resource strain: Not hard at all  . Food insecurity:    Worry: Never true    Inability: Never true  . Transportation needs:  Medical: No    Non-medical: No  Tobacco Use  . Smoking status: Former Games developermoker  . Smokeless tobacco: Never Used  . Tobacco comment: quit 2000  Substance and Sexual Activity  . Alcohol use: No    Alcohol/week: 0.0 standard drinks  . Drug use: No  . Sexual activity: Not Currently  Lifestyle  . Physical activity:    Days per week: 0 days    Minutes per session: 0 min  . Stress: Only a little  Relationships  . Social connections:    Talks on phone: More than three times a week    Gets together: More than three times a week    Attends religious service: 1 to 4 times per year    Active member of club or organization: Yes    Attends meetings of clubs or organizations: More than 4 times per year    Relationship status: Married  Other Topics  Concern  . Not on file  Social History Narrative  . Not on file    Family History  Problem Relation Age of Onset  . Breast cancer Mother   . Cancer Father        BACK  . Kidney cancer Father     Review of Systems  Constitutional: Negative for chills and fever.  HENT: Positive for congestion (resolved) and sore throat. Negative for ear pain, postnasal drip and sinus pain.   Respiratory: Positive for cough (dry) and wheezing. Negative for shortness of breath.   Gastrointestinal: Positive for diarrhea (little bit), nausea (little bit) and vomiting (from coughing).  Musculoskeletal: Negative for myalgias.  Neurological: Negative for light-headedness and headaches.       Objective:   Vitals:   09/11/18 1532  BP: (!) 152/86  Pulse: 71  Resp: 18  Temp: 98.4 F (36.9 C)  SpO2: 98%   Filed Weights   09/11/18 1532  Weight: (!) 307 lb (139.3 kg)   Body mass index is 51.09 kg/m.  Wt Readings from Last 3 Encounters:  09/11/18 (!) 307 lb (139.3 kg)  09/01/18 (!) 304 lb (137.9 kg)  09/01/18 (!) 304 lb 1.9 oz (137.9 kg)     Physical Exam GENERAL APPEARANCE: Appears stated age, well appearing, NAD EYES: conjunctiva clear, no icterus HEENT: bilateral tympanic membranes and ear canals normal, oropharynx with mild erythema, no thyromegaly, trachea midline, no cervical or supraclavicular lymphadenopathy LUNGS: Clear to auscultation without wheeze or crackles, unlabored breathing, good air entry bilaterally CARDIOVASCULAR: Normal S1,S2 without murmurs, no edema SKIN: warm, dry        Assessment & Plan:   See Problem List for Assessment and Plan of chronic medical problems.

## 2018-09-10 NOTE — Telephone Encounter (Signed)
Phone call ret'd to pt.  C/o dry cough and sore throat x 1.5 days.  Reported her cough is severe at times; described coughing so hard she feels like she could vomit.  Reported her cough is worse at night.  Denied fever/ chills, shortness of breath, wheezing, chest discomfort, or nasal drainage.  Requested to have cough medication sent to pharmacy.  Advised she will need to be evaluated in office, before medication is prescribed.  Stated she will need an appt. tomorrow afternoon; sched. with PCP tomorrow at 3:45 PM.  Care advice given per protocol.  Verb. Understanding.  Agreed with plan.       Reason for Disposition . SEVERE coughing spells (e.g., whooping sound after coughing, vomiting after coughing)  Answer Assessment - Initial Assessment Questions 1. ONSET: "When did the cough begin?"      1.5 days  2. SEVERITY: "How bad is the cough today?"      Coughing episodes; worse at night  3. RESPIRATORY DISTRESS: "Describe your breathing."      Denied  4. FEVER: "Do you have a fever?" If so, ask: "What is your temperature, how was it measured, and when did it start?"     Denied  5. HEMOPTYSIS: "Are you coughing up any blood?" If so ask: "How much?" (flecks, streaks, tablespoons, etc.)     nonproductive 6. TREATMENT: "What have you done so far to treat the cough?" (e.g., meds, fluids, humidifier)     Tylenol and using cough gtts.; drinking chicken broth  7. CARDIAC HISTORY: "Do you have any history of heart disease?" (e.g., heart attack, congestive heart failure)      Denied  8. LUNG HISTORY: "Do you have any history of lung disease?"  (e.g., pulmonary embolus, asthma, emphysema)     Denied  9. PE RISK FACTORS: "Do you have a history of blood clots?" (or: recent major surgery, recent prolonged travel, bedridden)     No  10. OTHER SYMPTOMS: "Do you have any other symptoms? (e.g., runny nose, wheezing, chest pain)       Denied runny nose, wheezing, chest pain, or shortness of breath 11. PREGNANCY:  "Is there any chance you are pregnant?" "When was your last menstrual period?"       n/a 12. TRAVEL: "Have you traveled out of the country in the last month?" (e.g., travel history, exposures)       No  Protocols used: COUGH - ACUTE NON-PRODUCTIVE-A-AH   

## 2018-09-11 ENCOUNTER — Ambulatory Visit: Payer: Medicare Other | Admitting: Internal Medicine

## 2018-09-11 ENCOUNTER — Telehealth: Payer: Self-pay | Admitting: Internal Medicine

## 2018-09-11 ENCOUNTER — Encounter: Payer: Self-pay | Admitting: Internal Medicine

## 2018-09-11 VITALS — BP 152/86 | HR 71 | Temp 98.4°F | Resp 18 | Ht 65.0 in | Wt 307.0 lb

## 2018-09-11 DIAGNOSIS — J069 Acute upper respiratory infection, unspecified: Secondary | ICD-10-CM | POA: Diagnosis not present

## 2018-09-11 MED ORDER — HYDROCODONE-HOMATROPINE 5-1.5 MG/5ML PO SYRP: 5.0000 mL | ORAL_SOLUTION | Freq: Three times a day (TID) | ORAL | 0 refills | Status: DC | PRN

## 2018-09-11 NOTE — Patient Instructions (Addendum)
Take 600-800 mg ibuprofen three times as needed alternating with tylenol   Use the cough syrup as needed.   Increase your rest and fluids   Call if no improvement      Upper Respiratory Infection, Adult Most upper respiratory infections (URIs) are a viral infection of the air passages leading to the lungs. A URI affects the nose, throat, and upper air passages. The most common type of URI is nasopharyngitis and is typically referred to as "the common cold." URIs run their course and usually go away on their own. Most of the time, a URI does not require medical attention, but sometimes a bacterial infection in the upper airways can follow a viral infection. This is called a secondary infection. Sinus and middle ear infections are common types of secondary upper respiratory infections. Bacterial pneumonia can also complicate a URI. A URI can worsen asthma and chronic obstructive pulmonary disease (COPD). Sometimes, these complications can require emergency medical care and may be life threatening. What are the causes? Almost all URIs are caused by viruses. A virus is a type of germ and can spread from one person to another. What increases the risk? You may be at risk for a URI if:  You smoke.  You have chronic heart or lung disease.  You have a weakened defense (immune) system.  You are very young or very old.  You have nasal allergies or asthma.  You work in crowded or poorly ventilated areas.  You work in health care facilities or schools.  What are the signs or symptoms? Symptoms typically develop 2-3 days after you come in contact with a cold virus. Most viral URIs last 7-10 days. However, viral URIs from the influenza virus (flu virus) can last 14-18 days and are typically more severe. Symptoms may include:  Runny or stuffy (congested) nose.  Sneezing.  Cough.  Sore throat.  Headache.  Fatigue.  Fever.  Loss of appetite.  Pain in your forehead, behind your  eyes, and over your cheekbones (sinus pain).  Muscle aches.  How is this diagnosed? Your health care provider may diagnose a URI by:  Physical exam.  Tests to check that your symptoms are not due to another condition such as: ? Strep throat. ? Sinusitis. ? Pneumonia. ? Asthma.  How is this treated? A URI goes away on its own with time. It cannot be cured with medicines, but medicines may be prescribed or recommended to relieve symptoms. Medicines may help:  Reduce your fever.  Reduce your cough.  Relieve nasal congestion.  Follow these instructions at home:  Take medicines only as directed by your health care provider.  Gargle warm saltwater or take cough drops to comfort your throat as directed by your health care provider.  Use a warm mist humidifier or inhale steam from a shower to increase air moisture. This may make it easier to breathe.  Drink enough fluid to keep your urine clear or pale yellow.  Eat soups and other clear broths and maintain good nutrition.  Rest as needed.  Return to work when your temperature has returned to normal or as your health care provider advises. You may need to stay home longer to avoid infecting others. You can also use a face mask and careful hand washing to prevent spread of the virus.  Increase the usage of your inhaler if you have asthma.  Do not use any tobacco products, including cigarettes, chewing tobacco, or electronic cigarettes. If you need help quitting, ask  your health care provider. How is this prevented? The best way to protect yourself from getting a cold is to practice good hygiene.  Avoid oral or hand contact with people with cold symptoms.  Wash your hands often if contact occurs.  There is no clear evidence that vitamin C, vitamin E, echinacea, or exercise reduces the chance of developing a cold. However, it is always recommended to get plenty of rest, exercise, and practice good nutrition. Contact a health  care provider if:  You are getting worse rather than better.  Your symptoms are not controlled by medicine.  You have chills.  You have worsening shortness of breath.  You have brown or red mucus.  You have yellow or brown nasal discharge.  You have pain in your face, especially when you bend forward.  You have a fever.  You have swollen neck glands.  You have pain while swallowing.  You have white areas in the back of your throat. Get help right away if:  You have severe or persistent: ? Headache. ? Ear pain. ? Sinus pain. ? Chest pain.  You have chronic lung disease and any of the following: ? Wheezing. ? Prolonged cough. ? Coughing up blood. ? A change in your usual mucus.  You have a stiff neck.  You have changes in your: ? Vision. ? Hearing. ? Thinking. ? Mood. This information is not intended to replace advice given to you by your health care provider. Make sure you discuss any questions you have with your health care provider. Document Released: 03/19/2001 Document Revised: 05/26/2016 Document Reviewed: 12/29/2013 Elsevier Interactive Patient Education  [employer]2018 Else[employer] Inc.

## 2018-09-11 NOTE — Telephone Encounter (Signed)
Copied from CRM [phone]. Topic: Quick Communication - See Telephone Encounter >> Sep 11, 2018  6:06 PM Jens SomMedley, Jennifer A wrote: CRM for notification. See Telephone encounter for: 09/11/18.  [facility] [place] [facility] regarding HYDROcodone-homatropine (HYCODAN) 5-1.5 MG/5ML syrup [[id number]][[id number]] The script was faxed needing a paper copy. Can the script be mailed please? [facility] [address] [place] [phone]

## 2018-09-11 NOTE — Telephone Encounter (Signed)
[facility] [place] HYDROcodone-homatropine (HYCODAN) 5-1.5 MG/5ML syrup [[id number]][[id number]]  The script was faxed.. needing a paper copy. Can the script be mailed please?   [facility] [address] [place] [phone]

## 2018-09-12 DIAGNOSIS — J069 Acute upper respiratory infection, unspecified: Secondary | ICD-10-CM | POA: Insufficient documentation

## 2018-09-12 NOTE — Assessment & Plan Note (Signed)
Symptoms likely viral in nature Hycodan cough syrup Continue symptomatic treatment with over-the-counter cold medications, Tylenol/ibuprofen Increase rest and fluids Call if symptoms worsen or do not improve

## 2018-09-13 MED ORDER — HYDROCODONE-HOMATROPINE 5-1.5 MG/5ML PO SYRP: 5.0000 mL | ORAL_SOLUTION | Freq: Three times a day (TID) | ORAL | 0 refills | Status: DC | PRN

## 2018-09-13 NOTE — Telephone Encounter (Signed)
Resent Sunday 12/8

## 2018-09-13 NOTE — Telephone Encounter (Signed)
Can this be resent 

## 2018-09-16 ENCOUNTER — Telehealth: Payer: Self-pay

## 2018-09-16 MED ORDER — AMOXICILLIN-POT CLAVULANATE 875-125 MG PO TABS: 1.0000 | ORAL_TABLET | Freq: Two times a day (BID) | ORAL | 0 refills | Status: DC

## 2018-09-16 NOTE — Telephone Encounter (Signed)
I sent an antibiotic to her pharmacy

## 2018-09-16 NOTE — Telephone Encounter (Signed)
Copied from CRM [phone]. Topic: Quick Communication - See Telephone Encounter >> Sep 16, 2018  7:48 AM Mare LoanBurton, Donna F wrote: Pt states that she saw Dr. Lawerance BachBurns last Friday and was told that if she was not feeling better still having head congestion, sore throat is coming back she would like to know what is the next step   Best number [phone]

## 2018-09-16 NOTE — Telephone Encounter (Signed)
Pt aware.

## 2018-10-19 ENCOUNTER — Other Ambulatory Visit: Payer: Self-pay | Admitting: Internal Medicine

## 2018-10-19 DIAGNOSIS — Z1231 Encounter for screening mammogram for malignant neoplasm of breast: Secondary | ICD-10-CM

## 2018-10-22 ENCOUNTER — Ambulatory Visit
Admission: RE | Admit: 2018-10-22 | Discharge: 2018-10-22 | Disposition: A | Discharge status: Home / Self Care | Payer: Medicare Other | Source: Ambulatory Visit | Attending: Internal Medicine | Admitting: Internal Medicine

## 2018-10-22 DIAGNOSIS — Z1231 Encounter for screening mammogram for malignant neoplasm of breast: Secondary | ICD-10-CM

## 2018-10-29 ENCOUNTER — Other Ambulatory Visit: Payer: Self-pay | Admitting: Cardiology

## 2018-10-29 ENCOUNTER — Other Ambulatory Visit: Payer: Self-pay | Admitting: Internal Medicine

## 2018-11-01 ENCOUNTER — Other Ambulatory Visit: Payer: Self-pay | Admitting: Internal Medicine

## 2018-12-01 ENCOUNTER — Other Ambulatory Visit: Payer: Self-pay | Admitting: Internal Medicine

## 2019-01-02 ENCOUNTER — Other Ambulatory Visit: Payer: Self-pay | Admitting: Internal Medicine

## 2019-01-20 IMAGING — US US THYROID
1 series · 13 of 25 positions shown · non-contrast
Comparison: None available

CLINICAL DATA: Goiter.

EXAM:
THYROID ULTRASOUND
TECHNIQUE: Ultrasound examination of the thyroid gland and adjacent soft
tissues was performed.

[Series 1: us thyroid · 0.10mm/px · 13 of 42 slices shown]
[im 1/42]
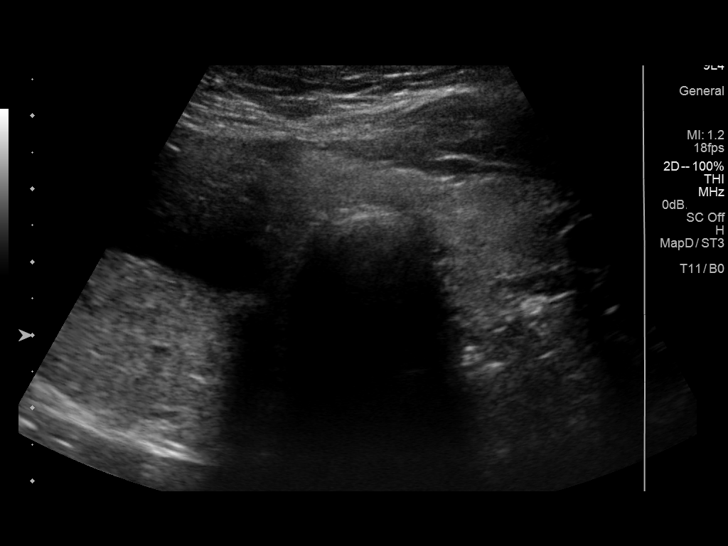
[im 4/42]
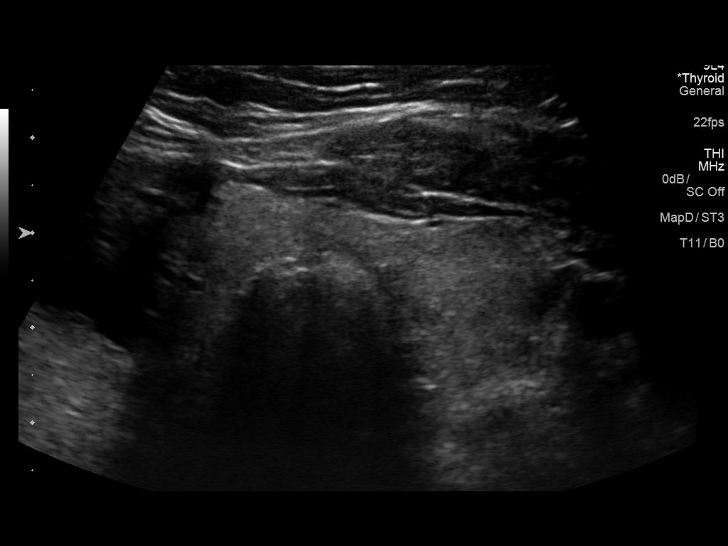
[im 7/42]
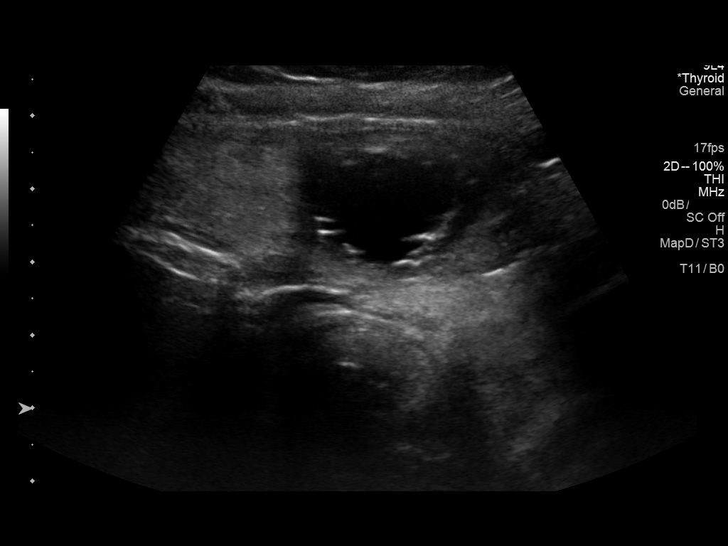
[im 11/42]
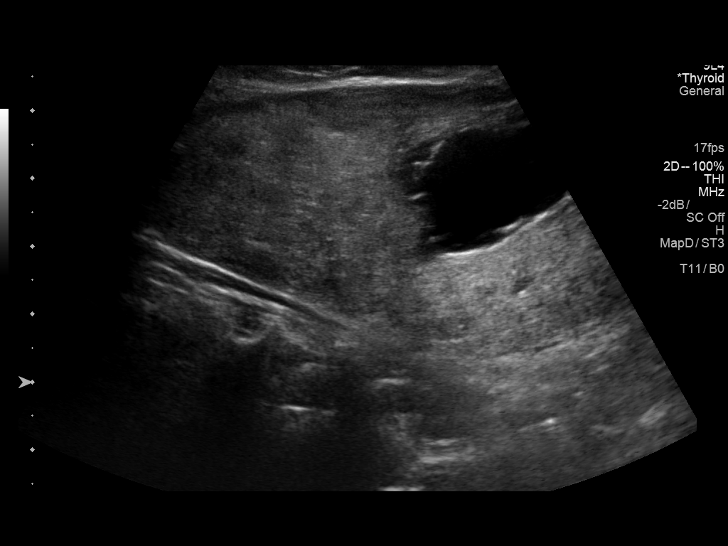
[im 14/42]
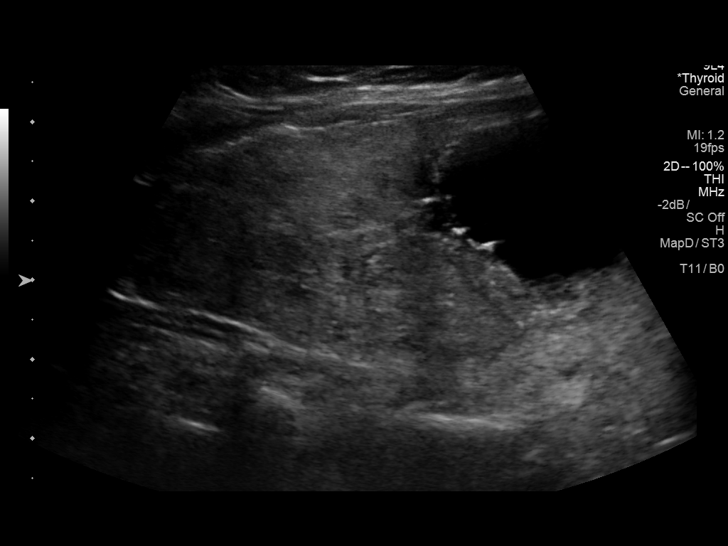
[im 18/42]
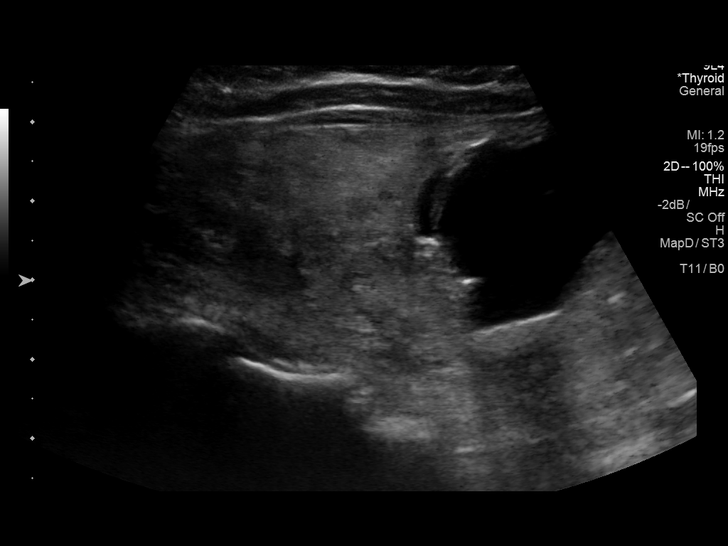
[im 21/42]
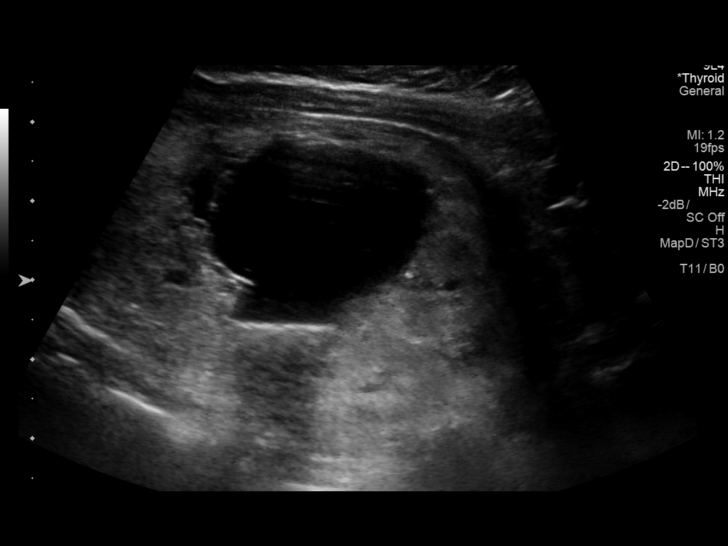
[im 24/42]
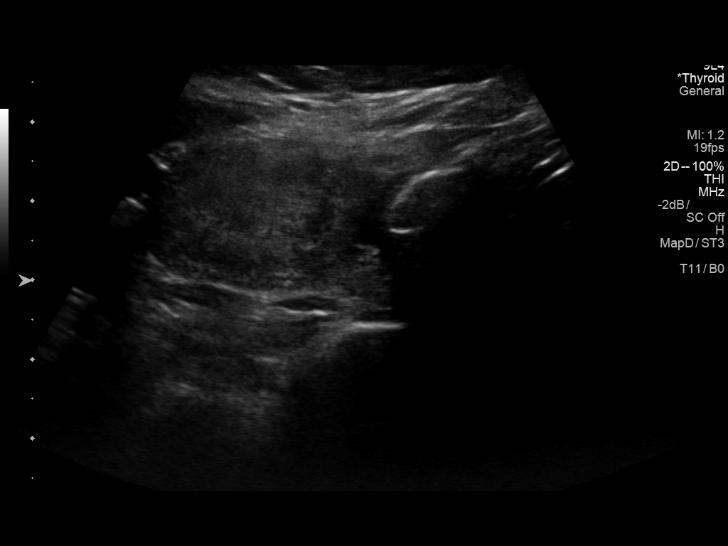
[im 28/42]
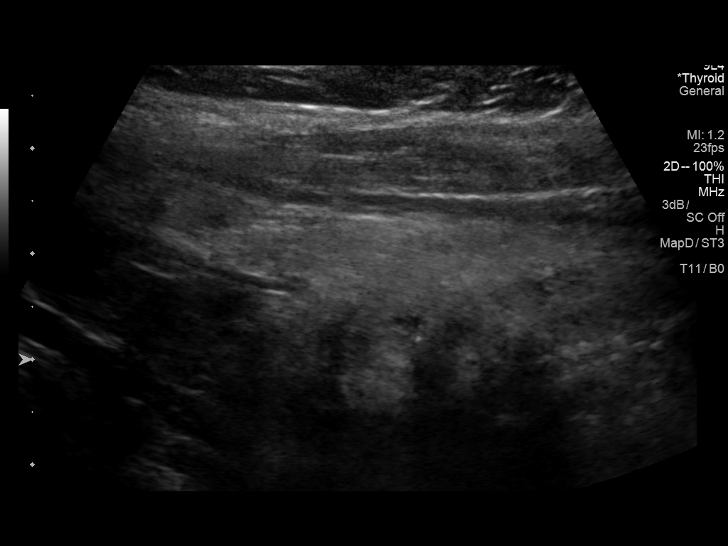
[im 31/42]
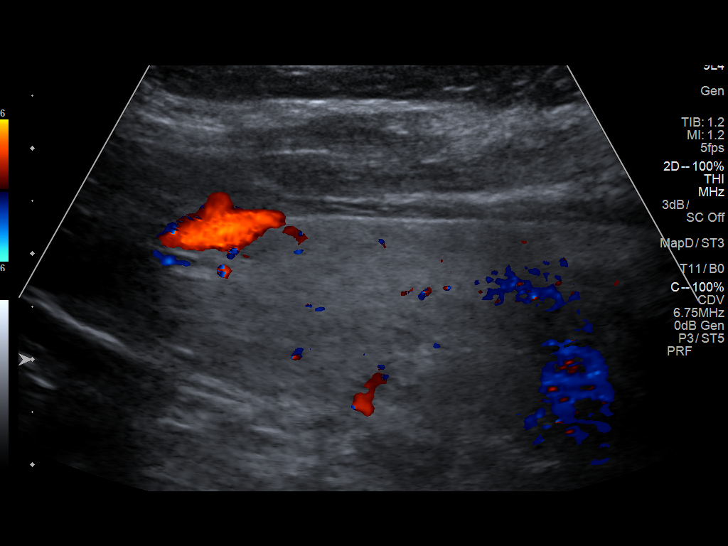
[im 35/42]
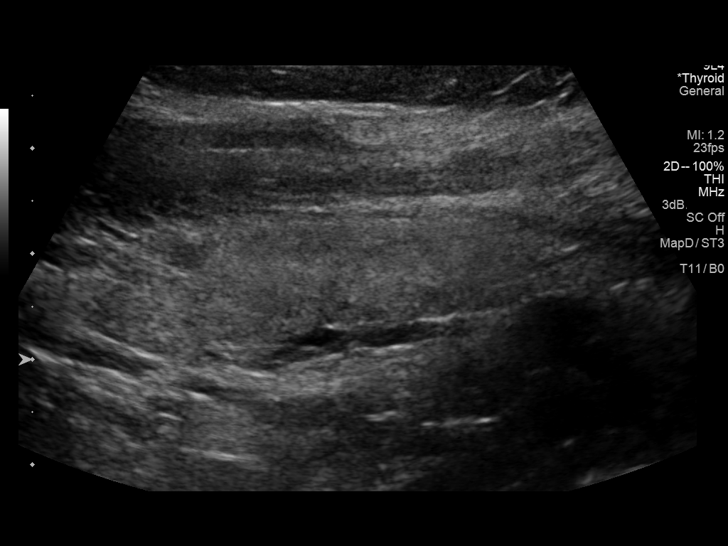
[im 38/42]
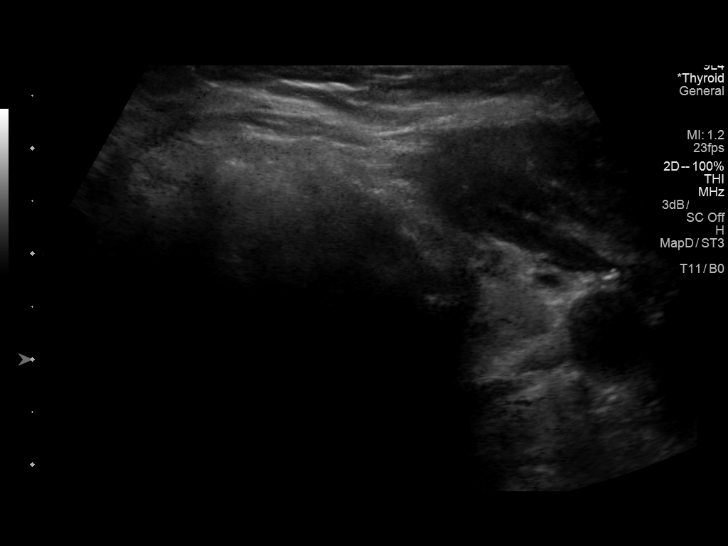
[im 42/42]
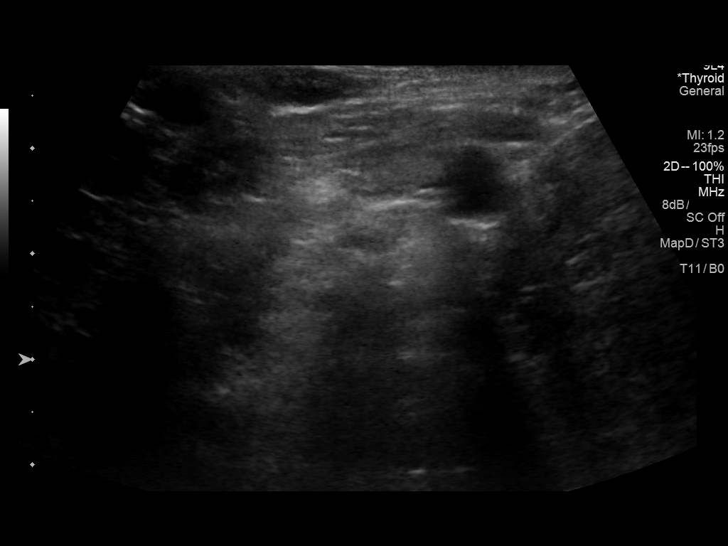

[13 of 25 positions shown; findings below may reference images not displayed]

FINDINGS: Parenchymal Echotexture: Mildly heterogenous

Isthmus: 3 mm

Right lobe: 8.3 x 4.1 x 4.6 cm

Left lobe: 5.9 x 1.7 x 1.5 cm

_________________________________________________________

Estimated total number of nodules >/= 1 cm: 1

Number of spongiform nodules >/=  2 cm not described below (TR1): 0

Number of mixed cystic and solid nodules >/= 1.5 cm not described
below (TR2): 0

_________________________________________________________

Nodule # 1:

Location: Right; Mid

Maximum size: 8.3 cm; Other 2 dimensions: 4.1 x 4.6 cm

Composition: mixed cystic and solid (1)

Echogenicity: isoechoic (1)

Shape: not taller-than-wide (0)

Margins: ill-defined (0)

Echogenic foci: large comet-tail artifacts (0)

ACR TI-RADS total points: 2.

ACR TI-RADS risk category: TR2 (2 points).

ACR TI-RADS recommendations:

This nodule does NOT meet TI-RADS criteria for biopsy or dedicated
follow-up.

_________________________________________________________

Tiny 5 mm hypoechoic nodule in the left thyroid upper pole
incidentally noted.

No adenopathy.
IMPRESSION: Large right thyroid 8.3 cm mixed cystic solid isoechoic nodule
encompasses the entire right lobe, compatible with a TR 2 nodule as
above. This nodule does not meet criteria for biopsy or follow-up.

The above is in keeping with the ACR TI-RADS recommendations - [HOSPITAL] 3189;[DATE].

## 2019-02-01 ENCOUNTER — Other Ambulatory Visit: Payer: Self-pay | Admitting: Internal Medicine

## 2019-03-08 NOTE — Progress Notes (Signed)
Virtual Visit via Telephone Note  I connected with Michelle MainsChristine M Hunt on 03/09/19 at  1:00 PM EDT by telephone and verified that I am speaking with the correct person using two identifiers.   I discussed the limitations of evaluation and management by telemedicine and the availability of in person appointments. The patient expressed understanding and agreed to proceed.  The patient is currently at home and I am in the office.    No referring provider.    History of Present Illness: She is here for follow up of her chronic medical conditions.   She is not exercising regularly.     Hypertension: She is taking her medication daily. She is compliant with a low sodium diet.  She denies chest pain, palpitations, edema, shortness of breath and regular headaches.     Prediabetes:  She is compliant with a low sugar/carbohydrate diet.  She is not exercising regularly.  GERD:  She is taking her medication daily as prescribed.  She denies any GERD symptoms and feels her GERD is well controlled.   OP:  She is taking fosamax (startin 08/2016). She is taking calcium/vitamin d daily.  She is not exercising.  Her dexa is up to date.  Morbid obesity:  She is not exercising.  She is not exercising regularly   Review of Systems  Constitutional: Negative for chills and fever.  Respiratory: Negative for cough, shortness of breath and wheezing.   Cardiovascular: Negative for chest pain, palpitations and leg swelling (right foot swells sometimes).  Gastrointestinal: Negative for heartburn.  Musculoskeletal: Positive for back pain.  Neurological: Negative for dizziness and headaches.      Social History   Socioeconomic History  . Marital status: Married    Spouse name: Not on file  . Number of children: 2  . Years of education: Not on file  . Highest education level: Not on file  Occupational History  . Not on file  Social Needs  . Financial resource strain: Not hard at all  . Food  insecurity:    Worry: Never true    Inability: Never true  . Transportation needs:    Medical: No    Non-medical: No  Tobacco Use  . Smoking status: Former Games developermoker  . Smokeless tobacco: Never Used  . Tobacco comment: quit 2000  Substance and Sexual Activity  . Alcohol use: No    Alcohol/week: 0.0 standard drinks  . Drug use: No  . Sexual activity: Not Currently  Lifestyle  . Physical activity:    Days per week: 0 days    Minutes per session: 0 min  . Stress: Only a little  Relationships  . Social connections:    Talks on phone: More than three times a week    Gets together: More than three times a week    Attends religious service: 1 to 4 times per year    Active member of club or organization: Yes    Attends meetings of clubs or organizations: More than 4 times per year    Relationship status: Married  Other Topics Concern  . Not on file  Social History Narrative  . Not on file     Assessment and Plan:  See Problem List for Assessment and Plan of chronic medical problems.   Follow Up Instructions:    I discussed the assessment and treatment plan with the patient. The patient was provided an opportunity to ask questions and all were answered. The patient agreed with the plan and demonstrated  an understanding of the instructions.   The patient was advised to call back or seek an in-person evaluation if the symptoms worsen or if the condition fails to improve as anticipated.  FU in 6 months - blood work at that  Total time on telephone call: 8 minutes  Pincus Sanes, MD

## 2019-03-09 ENCOUNTER — Ambulatory Visit (INDEPENDENT_AMBULATORY_CARE_PROVIDER_SITE_OTHER): Payer: Medicare Other | Admitting: Internal Medicine

## 2019-03-09 ENCOUNTER — Encounter: Payer: Self-pay | Admitting: Internal Medicine

## 2019-03-09 DIAGNOSIS — K219 Gastro-esophageal reflux disease without esophagitis: Secondary | ICD-10-CM

## 2019-03-09 DIAGNOSIS — I10 Essential (primary) hypertension: Secondary | ICD-10-CM

## 2019-03-09 DIAGNOSIS — M810 Age-related osteoporosis without current pathological fracture: Secondary | ICD-10-CM | POA: Diagnosis not present

## 2019-03-09 DIAGNOSIS — R7303 Prediabetes: Secondary | ICD-10-CM | POA: Diagnosis not present

## 2019-03-09 NOTE — Assessment & Plan Note (Signed)
GERD currently controlled-she did see improvement after readjusting her medication Continue current regimen

## 2019-03-09 NOTE — Assessment & Plan Note (Signed)
BP Readings from Last 3 Encounters:  09/11/18 (!) 152/86  09/01/18 (!) 148/88  09/01/18 (!) 148/88    given that this is a virtual visit - unsure how BP is - she does not monitor it at home Continue current medication F/u in 6 months

## 2019-03-09 NOTE — Assessment & Plan Note (Signed)
Sugars have been controlled She states she is compliant with a prediabetic diet She is not exercising regularly We will have her follow-up in 6 months and check A1c at that time

## 2019-03-09 NOTE — Assessment & Plan Note (Signed)
Taking Fosamax since 08/2016-we will continue for full 5 years DEXA up-to-date Taking calcium and vitamin D Not currently exercising, but increasing activity encouraged

## 2019-03-23 ENCOUNTER — Other Ambulatory Visit: Payer: Self-pay

## 2019-03-23 ENCOUNTER — Ambulatory Visit: Payer: Self-pay | Admitting: *Deleted

## 2019-03-23 ENCOUNTER — Encounter: Payer: Self-pay | Admitting: Internal Medicine

## 2019-03-23 ENCOUNTER — Ambulatory Visit (INDEPENDENT_AMBULATORY_CARE_PROVIDER_SITE_OTHER): Payer: Medicare Other | Admitting: Internal Medicine

## 2019-03-23 DIAGNOSIS — H8110 Benign paroxysmal vertigo, unspecified ear: Secondary | ICD-10-CM | POA: Diagnosis not present

## 2019-03-23 MED ORDER — MECLIZINE HCL 25 MG PO TABS: 25.0000 mg | ORAL_TABLET | Freq: Three times a day (TID) | ORAL | 1 refills | Status: AC | PRN

## 2019-03-23 NOTE — Assessment & Plan Note (Signed)
Symptoms consistent with BPPV Non focal neurological exam Trial of meclizine prn If no improvement she will call Can consider PT

## 2019-03-23 NOTE — Patient Instructions (Addendum)
Try meclizine as needed.  If your symptoms do not improve please let me know.      Benign Positional Vertigo Vertigo is the feeling that you or your surroundings are moving when they are not. Benign positional vertigo is the most common form of vertigo. This is usually a harmless condition (benign). This condition is positional. This means that symptoms are triggered by certain movements and positions. This condition can be dangerous if it occurs while you are doing something that could cause harm to you or others. This includes activities such as driving or operating machinery. What are the causes? In many cases, the cause of this condition is not known. It may be caused by a disturbance in an area of the inner ear that helps your brain to sense movement and balance. This disturbance can be caused by:  Viral infection (labyrinthitis).  Head injury.  Repetitive motion, such as jumping, dancing, or running. What increases the risk? You are more likely to develop this condition if:  You are a woman.  You are 65 years of age or older. What are the signs or symptoms? Symptoms of this condition usually happen when you move your head or your eyes in different directions. Symptoms may start suddenly, and usually last for less than a minute. They include:  Loss of balance and falling.  Feeling like you are spinning or moving.  Feeling like your surroundings are spinning or moving.  Nausea and vomiting.  Blurred vision.  Dizziness.  Involuntary eye movement (nystagmus). Symptoms can be mild and cause only minor problems, or they can be severe and interfere with daily life. Episodes of benign positional vertigo may return (recur) over time. Symptoms may improve over time. How is this diagnosed? This condition may be diagnosed based on:  Your medical history.  Physical exam of the head, neck, and ears.  Tests, such as: ? MRI. ? CT scan. ? Eye movement tests. Your health care  provider may ask you to change positions quickly while he or she watches you for symptoms of benign positional vertigo, such as nystagmus. Eye movement may be tested with a variety of exams that are designed to evaluate or stimulate vertigo. ? An electroencephalogram (EEG). This records electrical activity in your brain. ? Hearing tests. You may be referred to a health care provider who specializes in ear, nose, and throat (ENT) problems (otolaryngologist) or a provider who specializes in disorders of the nervous system (neurologist). How is this treated?  This condition may be treated in a session in which your health care provider moves your head in specific positions to adjust your inner ear back to normal. Treatment for this condition may take several sessions. Surgery may be needed in severe cases, but this is rare. In some cases, benign positional vertigo may resolve on its own in 2-4 weeks. Follow these instructions at home: Safety  Move slowly. Avoid sudden body or head movements or certain positions, as told by your health care provider.  Avoid driving until your health care provider says it is safe for you to do so.  Avoid operating heavy machinery until your health care provider says it is safe for you to do so.  Avoid doing any tasks that would be dangerous to you or others if vertigo occurs.  If you have trouble walking or keeping your balance, try using a cane for stability. If you feel dizzy or unstable, sit down right away.  Return to your normal activities as told by  your health care provider. Ask your health care provider what activities are safe for you. General instructions  Take over-the-counter and prescription medicines only as told by your health care provider.  Drink enough fluid to keep your urine pale yellow.  Keep all follow-up visits as told by your health care provider. This is important. Contact a health care provider if:  You have a fever.  Your  condition gets worse or you develop new symptoms.  Your family or friends notice any behavioral changes.  You have nausea or vomiting that gets worse.  You have numbness or a "pins and needles" sensation. Get help right away if you:  Have difficulty speaking or moving.  Are always dizzy.  Faint.  Develop severe headaches.  Have weakness in your legs or arms.  Have changes in your hearing or vision.  Develop a stiff neck.  Develop sensitivity to light. Summary  Vertigo is the feeling that you or your surroundings are moving when they are not. Benign positional vertigo is the most common form of vertigo.  The cause of this condition is not known. It may be caused by a disturbance in an area of the inner ear that helps your brain to sense movement and balance.  Symptoms include loss of balance and falling, feeling that you or your surroundings are moving, nausea and vomiting, and blurred vision.  This condition can be diagnosed based on symptoms, physical exam, and other tests, such as MRI, CT scan, eye movement tests, and hearing tests.  Follow safety instructions as told by your health care provider. You will also be told when to contact your health care provider in case of problems. This information is not intended to replace advice given to you by your health care provider. Make sure you discuss any questions you have with your health care provider. Document Released: 07/01/2006 Document Revised: 03/04/2018 Document Reviewed: 03/04/2018 Elsevier Interactive Patient Education  2019 [employer].

## 2019-03-23 NOTE — Progress Notes (Signed)
Subjective:    Patient ID: Michelle Hunt, female    DOB: 08/04/1956, 63 y.o.   MRN: [id number]  HPI The patient is here for an acute visit.   Yesterday when she got up she felt off balance and she felt woozy She denies a true spinning sensation.  She feels the symptoms most with changes in head positionand changes in head position.  She has had some nausea.  She denies any headaches, numbness/tingling or weakness in her extremities.   She denies prior episodes.    Medications and allergies reviewed with patient and updated if appropriate.  Patient Active Problem List   Diagnosis Date Noted  . Prediabetes 09/01/2018  . Leg swelling 02/17/2018  . Dyspnea 02/17/2018  . Epidermoid cyst of neck 02/17/2018  . Anxiety 07/30/2017  . Rib pain on left side 07/30/2017  . Chronic upper back pain 07/30/2017  . GERD (gastroesophageal reflux disease) 07/26/2016  . Morbid obesity (HCC) 07/26/2016  . Abnormal EKG 12/31/2015  . Essential hypertension 08/19/2014  . Vitamin D deficiency 02/23/2010  . 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 Depression 07/14/2009  . Osteoporosis 07/14/2009    Current Outpatient Medications on File Prior to Visit  Medication Sig Dispense Refill  . ABILIFY 15 MG tablet Take 15 mg by mouth daily.  12  . alendronate (FOSAMAX) 70 MG tablet Take 1 tablet (70 mg total) by mouth once a week. Take with a full glass of water on an empty stomach. 12 tablet 3  . ALPRAZolam (XANAX) 0.5 MG tablet Take 0.5 mg by mouth at bedtime as needed for anxiety.    . calcium carbonate (CALCIUM 600) 600 MG TABS tablet Take 1 tablet (600 mg total) by mouth 2 (two) times daily with a meal. 60 tablet   . diltiazem (TIAZAC) 300 MG 24 hr capsule TAKE ONE CAPSULE BY MOUTH EVERY DAY 90 capsule 1  . famotidine (PEPCID) 40 MG tablet Take 1 tablet (40 mg total) by mouth daily. 90 tablet 1  . hydrochlorothiazide (HYDRODIURIL) 25 MG tablet Take 1 tablet (25 mg total) by mouth daily. Needs  follow up visit for more refills. 90 tablet 0  . lamoTRIgine (LAMICTAL) 25 MG tablet Take 25 mg by mouth daily.     . metoprolol succinate (TOPROL-XL) 50 MG 24 hr tablet TAKE 1 AND 1/2 TABLETS(75 MG) BY MOUTH DAILY 135 tablet 1  . omeprazole (PRILOSEC) 20 MG capsule TAKE 1 CAPSULE(20 MG) BY MOUTH DAILY 30 MINUTES BEFORE A MEAL 90 capsule 1  . potassium chloride SA (K-DUR,KLOR-CON) 20 MEQ tablet TAKE 1 TABLET(20 MEQ) BY MOUTH DAILY 30 tablet 2  . Vitamin D, Cholecalciferol, 1000 units TABS Take 4,000 Units by mouth daily. 60 tablet    No current facility-administered medications on file prior to visit.     Past Medical History:  Diagnosis Date  . Abnormal EKG 12/31/2015   EKG 01/2017 normal  . Essential hypertension 08/19/2014    Past Surgical History:  Procedure Laterality Date  . CHOLECYSTECTOMY    . TUBAL LIGATION      Social History   Socioeconomic History  . Marital status: Married    Spouse name: Not on file  . Number of children: 2  . Years of education: Not on file  . Highest education level: Not on file  Occupational History  . Not on file  Social Needs  . Financial resource strain: Not hard at all  . Food insecurity    Worry: Never true  Inability: Never true  . Transportation needs    Medical: No    Non-medical: No  Tobacco Use  . Smoking status: Former Research scientist (life sciences)  . Smokeless tobacco: Never Used  . Tobacco comment: quit 2000  Substance and Sexual Activity  . Alcohol use: No    Alcohol/week: 0.0 standard drinks  . Drug use: No  . Sexual activity: Not Currently  Lifestyle  . Physical activity    Days per week: 0 days    Minutes per session: 0 min  . Stress: Only a little  Relationships  . Social connections    Talks on phone: More than three times a week    Gets together: More than three times a week    Attends religious service: 1 to 4 times per year    Active member of club or organization: Yes    Attends meetings of clubs or organizations: More  than 4 times per year    Relationship status: Married  Other Topics Concern  . Not on file  Social History Narrative  . Not on file    Family History  Problem Relation Age of Onset  . Breast cancer Mother 49  . Cancer Father        BACK  . Kidney cancer Father     Review of Systems  Constitutional: Negative for chills and fever.  HENT: Negative for congestion, ear pain, sinus pressure and sore throat.   Eyes: Negative for visual disturbance.  Respiratory: Negative for cough, shortness of breath and wheezing.   Cardiovascular: Positive for palpitations. Negative for chest pain and leg swelling.  Gastrointestinal: Positive for nausea.  Neurological: Negative for weakness, numbness and headaches.       Objective:   Vitals:   03/23/19 1549  BP: 128/86  Pulse: 81  Resp: 18  Temp: 98.1 F (36.7 C)  SpO2: 99%   BP Readings from Last 3 Encounters:  03/23/19 128/86  09/11/18 (!) 152/86  09/01/18 (!) 148/88   Wt Readings from Last 3 Encounters:  03/23/19 (!) 301 lb 12.8 oz (136.9 kg)  09/11/18 (!) 307 lb (139.3 kg)  09/01/18 (!) 304 lb (137.9 kg)   Body mass index is 50.22 kg/m.   Physical Exam    GENERAL APPEARANCE: Appears stated age, well appearing, NAD EYES: conjunctiva clear, no icterus HEENT: bilateral tympanic membranes and ear canals normal, oropharynx with no erythema, no thyromegaly, trachea midline, no cervical or supraclavicular lymphadenopathy LUNGS: Clear to auscultation without wheeze or crackles, unlabored breathing, good air entry bilaterally CARDIOVASCULAR: Normal S1,S2 without murmurs, no edema Neuro:  Normal sensation and strength all extremities SKIN: Warm, dry      Assessment & Plan:    See Problem List for Assessment and Plan of chronic medical problems.

## 2019-03-23 NOTE — Telephone Encounter (Signed)
Patient is calling with c/o vision problems and wooziness. BP 137/78 and she states she hydrates well. Patient states she has no cough,fever,or respiratory symptoms. No chest pain, no swelling in extremities. Reason for Disposition . [1] Dizziness (vertigo) present now AND [2] age > 89  (Exception: prior physician evaluation for this AND no different/worse than usual)  Answer Assessment - Initial Assessment Questions 1. DESCRIPTION: "Describe your dizziness."     Off balance 2. VERTIGO: "Do you feel like either you or the room is spinning or tilting?"      A little 3. LIGHTHEADED: "Do you feel lightheaded?" (e.g., somewhat faint, woozy, weak upon standing)     Woozy, feels like she needs to catch herself 4. SEVERITY: "How bad is it?"  "Can you walk?"   - MILD - Feels unsteady but walking normally.   - MODERATE - Feels very unsteady when walking, but not falling; interferes with normal activities (e.g., school, work) .   - SEVERE - Unable to walk without falling (requires assistance).     Worse today than yesterday- sometimes- mild/moderate 5. ONSET:  "When did the dizziness begin?"     Yesterday and today 6. AGGRAVATING FACTORS: "Does anything make it worse?" (e.g., standing, change in head position)     Standing/moving 7. CAUSE: "What do you think is causing the dizziness?"     Not sure 8. RECURRENT SYMPTOM: "Have you had dizziness before?" If so, ask: "When was the last time?" "What happened that time?"     no 9. OTHER SYMPTOMS: "Do you have any other symptoms?" (e.g., headache, weakness, numbness, vomiting, earache)     no 10. PREGNANCY: "Is there any chance you are pregnant?" "When was your last menstrual period?"       n/a  Protocols used: DIZZINESS - VERTIGO-A-AH

## 2019-03-23 NOTE — Telephone Encounter (Signed)
Seeing you at 4:00

## 2019-03-24 ENCOUNTER — Other Ambulatory Visit: Payer: Self-pay | Admitting: Internal Medicine

## 2019-04-24 ENCOUNTER — Other Ambulatory Visit: Payer: Self-pay | Admitting: Internal Medicine

## 2019-04-29 ENCOUNTER — Other Ambulatory Visit: Payer: Self-pay | Admitting: Internal Medicine

## 2019-06-17 ENCOUNTER — Other Ambulatory Visit: Payer: Self-pay | Admitting: Internal Medicine

## 2019-07-08 IMAGING — MG DIGITAL SCREENING BILATERAL MAMMOGRAM WITH TOMO AND CAD
6 of 12 series · 6 of 36 positions shown · non-contrast
Comparison: Previous exam(s).

ACR Breast Density Category a: The breast tissue is almost entirely
fatty.

CLINICAL DATA: Screening.

EXAM:
DIGITAL SCREENING BILATERAL MAMMOGRAM WITH TOMO AND CAD

[L CC synth-2D]
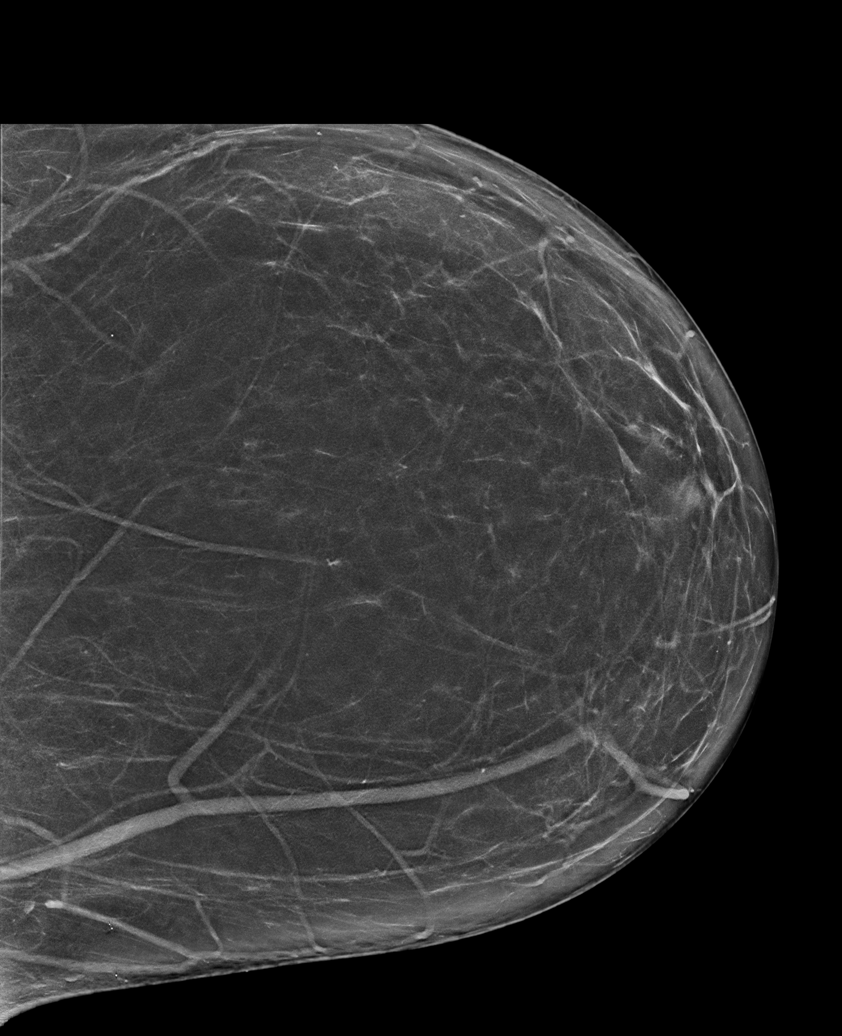

[L MLO synth-2D (1 of 2)]
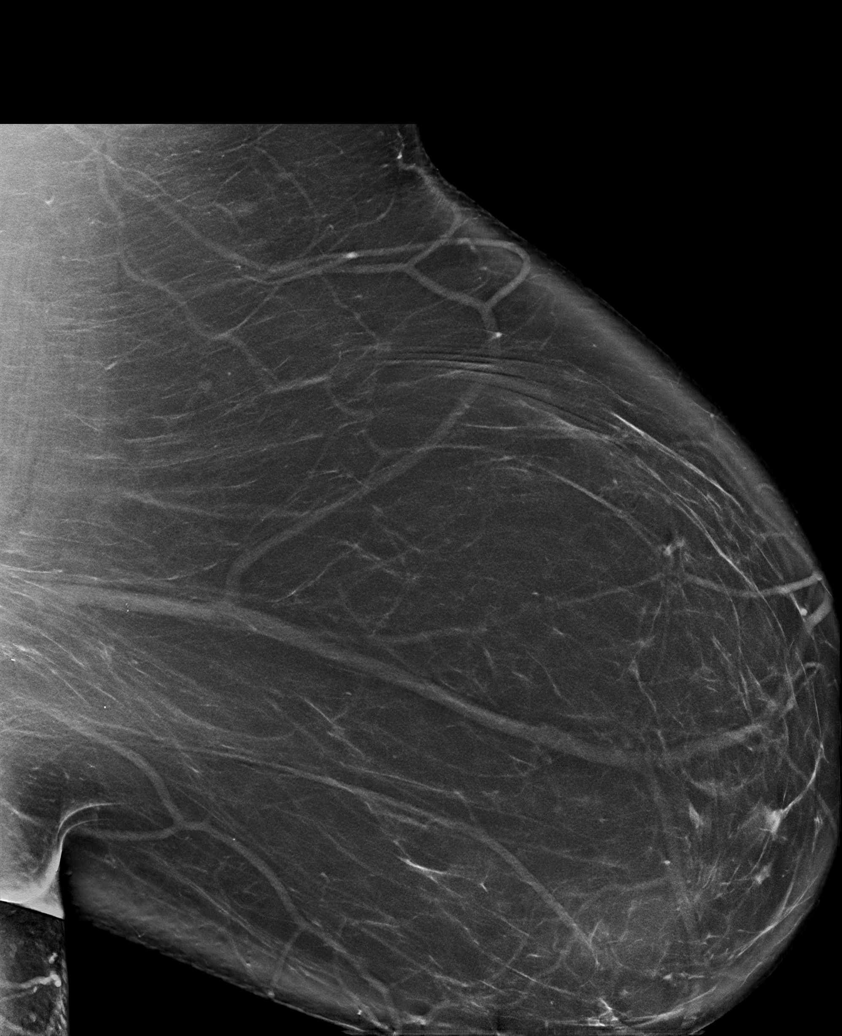

[L MLO synth-2D (2 of 2)]
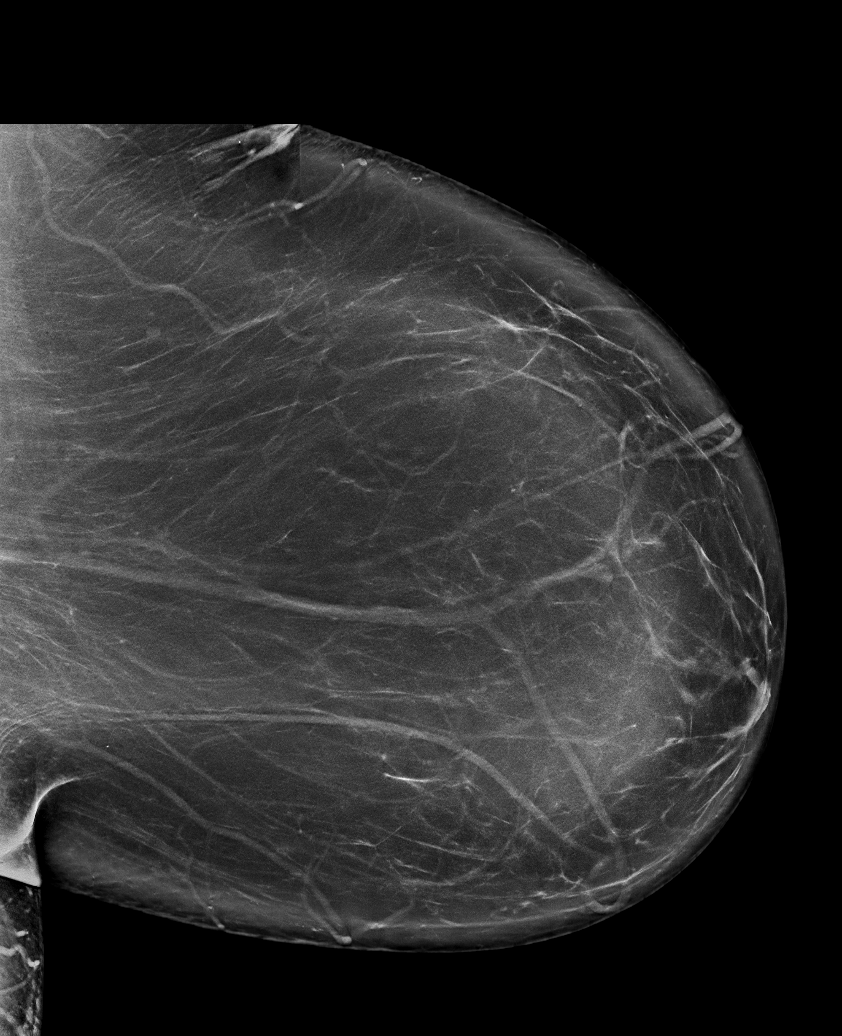

[R MLO synth-2D (1 of 2)]
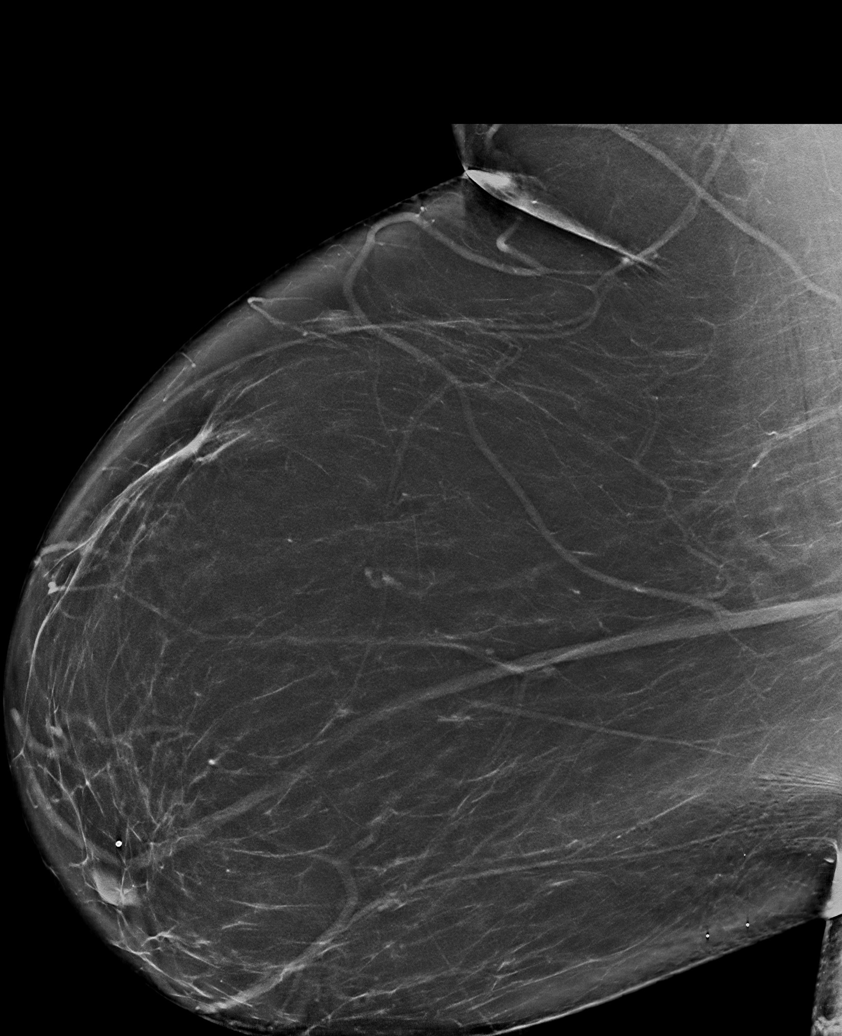

[R CC synth-2D]
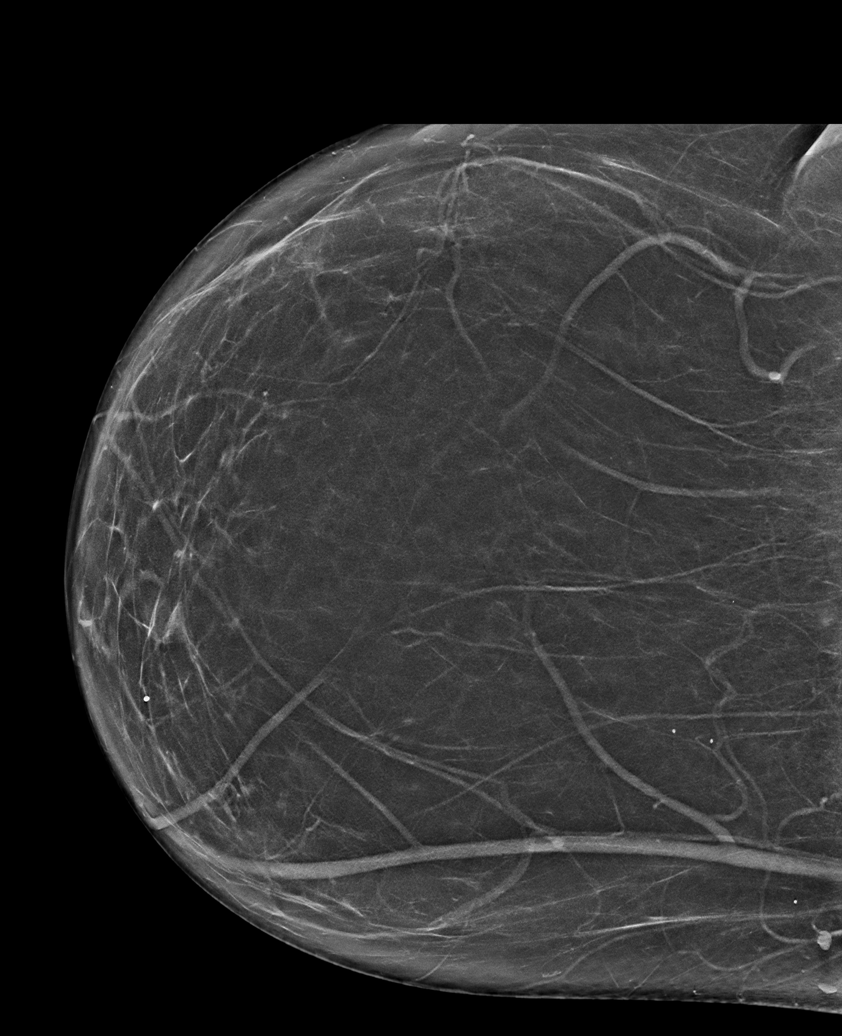

[R MLO synth-2D (2 of 2)]
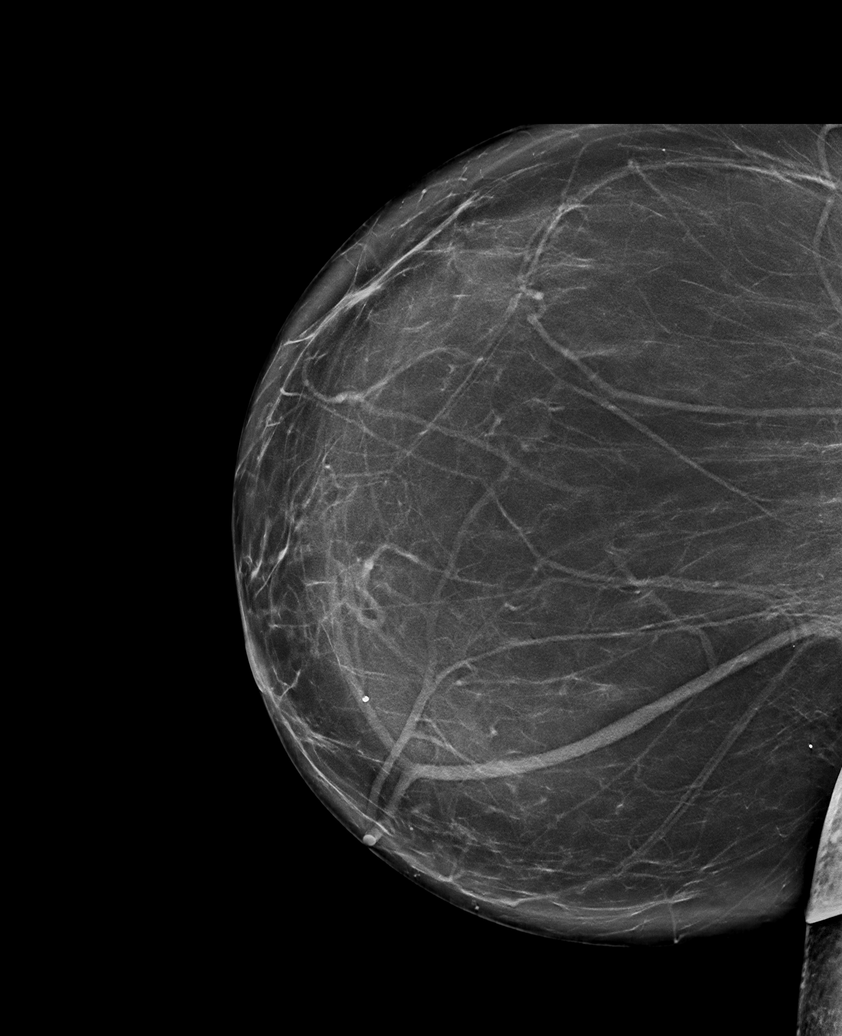

[6 of 36 positions shown; findings below may reference images not displayed]

FINDINGS: There are no findings suspicious for malignancy. Images were
processed with CAD.
IMPRESSION: No mammographic evidence of malignancy. A result letter of this
screening mammogram will be mailed directly to the patient.

RECOMMENDATION:
Screening mammogram in one year. (Code:[id number]-[id number])

BI-RADS CATEGORY  1: Negative.

## 2019-07-09 ENCOUNTER — Other Ambulatory Visit: Payer: Self-pay | Admitting: Internal Medicine

## 2019-08-03 ENCOUNTER — Other Ambulatory Visit: Payer: Self-pay | Admitting: Internal Medicine

## 2019-09-20 NOTE — Progress Notes (Signed)
Virtual Visit via Video Note  I connected with Michelle Hunt on 09/21/19 at  2:00 PM EST by a video enabled telemedicine application and verified that I am speaking with the correct person using two identifiers.   I discussed the limitations of evaluation and management by telemedicine and the availability of in person appointments. The patient expressed understanding and agreed to proceed.  Present for the visit:  Myself, Dr Cheryll Cockayne, Rose Phi.  The patient is currently at home and I am in the office.    No referring provider.    History of Present Illness: She is here for follow up of her chronic medical conditions.   She is not exercising regularly.  Hypertension: She is taking her medication daily. She is compliant with a low sodium diet.  She denies chest pain, palpitations, shortness of breath and regular headaches.     Prediabetes:  She is compliant with a low sugar/carbohydrate diet.  She is not exercising regularly.  Marland KitchenGERD:  She is taking her medication daily as prescribed.  She denies any GERD symptoms and feels her GERD is well controlled.   OP:  She is taking fosamax since 08/2016.  She takes calcium and vitamin d daily.  She is not exercising regularly.     Upper back pain and stiffness: For a while now she has been dealing with upper back pain and stiffness.  She has not seen anyone for this.  She takes a hot shower twice a day and that helps temporarily with the stiffness.  Advil does not help much.   Review of Systems  Constitutional: Negative for chills and fever.  Respiratory: Negative for cough, shortness of breath and wheezing.   Cardiovascular: Positive for leg swelling. Negative for chest pain and palpitations.  Musculoskeletal: Positive for back pain.  Neurological: Negative for dizziness and headaches.     Social History   Socioeconomic History  . Marital status: Married    Spouse name: Not on file  . Number of children: 2  . Years of  education: Not on file  . Highest education level: Not on file  Occupational History  . Not on file  Tobacco Use  . Smoking status: Former Games developer  . Smokeless tobacco: Never Used  . Tobacco comment: quit 2000  Substance and Sexual Activity  . Alcohol use: No    Alcohol/week: 0.0 standard drinks  . Drug use: No  . Sexual activity: Not Currently  Other Topics Concern  . Not on file  Social History Narrative  . Not on file   Social Determinants of Health   Financial Resource Strain:   . Difficulty of Paying Living Expenses: Not on file  Food Insecurity:   . Worried About Programme researcher, broadcasting/film/video in the Last Year: Not on file  . Ran Out of Food in the Last Year: Not on file  Transportation Needs:   . Lack of Transportation (Medical): Not on file  . Lack of Transportation (Non-Medical): Not on file  Physical Activity:   . Days of Exercise per Week: Not on file  . Minutes of Exercise per Session: Not on file  Stress:   . Feeling of Stress : Not on file  Social Connections:   . Frequency of Communication with Friends and Family: Not on file  . Frequency of Social Gatherings with Friends and Family: Not on file  . Attends Religious Services: Not on file  . Active Member of Clubs or Organizations: Not on file  .  Attends Archivist Meetings: Not on file  . Marital Status: Not on file     Observations/Objective: Appears well in NAD Breathing normally Skin appears warm and dry  Assessment and Plan:  See Problem List for Assessment and Plan of chronic medical problems.   Follow Up Instructions:    I discussed the assessment and treatment plan with the patient. The patient was provided an opportunity to ask questions and all were answered. The patient agreed with the plan and demonstrated an understanding of the instructions.   The patient was advised to call back or seek an in-person evaluation if the symptoms worsen or if the condition fails to improve as  anticipated.  Follow-up in 6 months  Binnie Rail, MD

## 2019-09-21 ENCOUNTER — Ambulatory Visit (INDEPENDENT_AMBULATORY_CARE_PROVIDER_SITE_OTHER): Payer: Medicare Other | Admitting: Internal Medicine

## 2019-09-21 ENCOUNTER — Encounter: Payer: Self-pay | Admitting: Internal Medicine

## 2019-09-21 DIAGNOSIS — R7303 Prediabetes: Secondary | ICD-10-CM

## 2019-09-21 DIAGNOSIS — M810 Age-related osteoporosis without current pathological fracture: Secondary | ICD-10-CM

## 2019-09-21 DIAGNOSIS — K219 Gastro-esophageal reflux disease without esophagitis: Secondary | ICD-10-CM | POA: Diagnosis not present

## 2019-09-21 DIAGNOSIS — I10 Essential (primary) hypertension: Secondary | ICD-10-CM | POA: Diagnosis not present

## 2019-09-21 DIAGNOSIS — G8929 Other chronic pain: Secondary | ICD-10-CM

## 2019-09-21 DIAGNOSIS — M549 Dorsalgia, unspecified: Secondary | ICD-10-CM

## 2019-09-21 MED ORDER — METHOCARBAMOL 500 MG PO TABS: 500.0000 mg | ORAL_TABLET | Freq: Three times a day (TID) | ORAL | 2 refills | Status: AC | PRN

## 2019-09-21 NOTE — Assessment & Plan Note (Signed)
BP Readings from Last 3 Encounters:  03/23/19 128/86  09/11/18 (!) 152/86  09/01/18 (!) 148/88   Blood pressure was well controlled last time she was seen We will continue current medications at current doses We will hold off on blood work until her next visit in 6 months

## 2019-09-21 NOTE — Assessment & Plan Note (Signed)
GERD controlled Continue daily medication  

## 2019-09-21 NOTE — Assessment & Plan Note (Signed)
We will hold off on checking A1c until next visit Encouraged low sugar/carbohydrate diet

## 2019-09-21 NOTE — Assessment & Plan Note (Signed)
Taking Fosamax for 3 years-continue Continue calcium and vitamin D Encouraged regular exercise

## 2019-09-21 NOTE — Assessment & Plan Note (Signed)
Chronic issue She has never seen sports medicine/orthopedics Having increased stiffness and pain now Heat helps temporarily, Advil not very effective Will try, methocarbamol if covered-if not covered or causes drowsiness she will let me know and we will try something different Deferred referral for specialist at this time because of Covid pandemic

## 2019-10-11 ENCOUNTER — Other Ambulatory Visit: Payer: Self-pay | Admitting: Internal Medicine

## 2019-10-17 ENCOUNTER — Other Ambulatory Visit: Payer: Self-pay | Admitting: Internal Medicine

## 2019-10-24 ENCOUNTER — Other Ambulatory Visit: Payer: Self-pay | Admitting: Internal Medicine

## 2019-12-06 DIAGNOSIS — E559 Vitamin D deficiency, unspecified: Secondary | ICD-10-CM | POA: Diagnosis not present

## 2019-12-06 DIAGNOSIS — I10 Essential (primary) hypertension: Secondary | ICD-10-CM | POA: Diagnosis not present

## 2019-12-06 DIAGNOSIS — Z1211 Encounter for screening for malignant neoplasm of colon: Secondary | ICD-10-CM | POA: Diagnosis not present

## 2019-12-06 DIAGNOSIS — Z803 Family history of malignant neoplasm of breast: Secondary | ICD-10-CM | POA: Diagnosis not present

## 2019-12-06 DIAGNOSIS — Z1159 Encounter for screening for other viral diseases: Secondary | ICD-10-CM | POA: Diagnosis not present

## 2019-12-06 DIAGNOSIS — E049 Nontoxic goiter, unspecified: Secondary | ICD-10-CM | POA: Diagnosis not present

## 2019-12-06 DIAGNOSIS — F3342 Major depressive disorder, recurrent, in full remission: Secondary | ICD-10-CM | POA: Diagnosis not present

## 2019-12-06 DIAGNOSIS — F419 Anxiety disorder, unspecified: Secondary | ICD-10-CM | POA: Diagnosis not present

## 2019-12-06 DIAGNOSIS — M810 Age-related osteoporosis without current pathological fracture: Secondary | ICD-10-CM | POA: Diagnosis not present

## 2019-12-06 DIAGNOSIS — Z7689 Persons encountering health services in other specified circumstances: Secondary | ICD-10-CM | POA: Diagnosis not present

## 2019-12-06 DIAGNOSIS — R7303 Prediabetes: Secondary | ICD-10-CM | POA: Diagnosis not present

## 2019-12-06 DIAGNOSIS — Z1231 Encounter for screening mammogram for malignant neoplasm of breast: Secondary | ICD-10-CM | POA: Diagnosis not present

## 2019-12-20 DIAGNOSIS — Z13220 Encounter for screening for lipoid disorders: Secondary | ICD-10-CM | POA: Diagnosis not present

## 2019-12-20 DIAGNOSIS — E871 Hypo-osmolality and hyponatremia: Secondary | ICD-10-CM | POA: Diagnosis not present

## 2019-12-20 DIAGNOSIS — R0600 Dyspnea, unspecified: Secondary | ICD-10-CM | POA: Diagnosis not present

## 2019-12-20 DIAGNOSIS — E876 Hypokalemia: Secondary | ICD-10-CM | POA: Diagnosis not present

## 2019-12-20 DIAGNOSIS — N3090 Cystitis, unspecified without hematuria: Secondary | ICD-10-CM | POA: Diagnosis not present

## 2019-12-20 DIAGNOSIS — R9431 Abnormal electrocardiogram [ECG] [EKG]: Secondary | ICD-10-CM | POA: Diagnosis not present

## 2019-12-20 DIAGNOSIS — M7989 Other specified soft tissue disorders: Secondary | ICD-10-CM | POA: Diagnosis not present

## 2019-12-20 DIAGNOSIS — I10 Essential (primary) hypertension: Secondary | ICD-10-CM | POA: Diagnosis not present

## 2020-01-11 DIAGNOSIS — M810 Age-related osteoporosis without current pathological fracture: Secondary | ICD-10-CM | POA: Diagnosis not present

## 2020-01-11 DIAGNOSIS — Z1231 Encounter for screening mammogram for malignant neoplasm of breast: Secondary | ICD-10-CM | POA: Diagnosis not present

## 2020-01-11 DIAGNOSIS — Z803 Family history of malignant neoplasm of breast: Secondary | ICD-10-CM | POA: Diagnosis not present

## 2020-01-11 DIAGNOSIS — M8589 Other specified disorders of bone density and structure, multiple sites: Secondary | ICD-10-CM | POA: Diagnosis not present

## 2020-01-12 ENCOUNTER — Other Ambulatory Visit: Payer: Self-pay

## 2020-01-12 MED ORDER — DILTIAZEM HCL ER BEADS 300 MG PO CP24: ORAL_CAPSULE | ORAL | 0 refills | Status: AC

## 2020-01-12 MED ORDER — METOPROLOL SUCCINATE ER 50 MG PO TB24: 75.0000 mg | ORAL_TABLET | Freq: Every day | ORAL | 0 refills | Status: AC

## 2020-01-13 ENCOUNTER — Other Ambulatory Visit: Payer: Self-pay | Admitting: Internal Medicine

## 2020-01-26 DIAGNOSIS — M8589 Other specified disorders of bone density and structure, multiple sites: Secondary | ICD-10-CM | POA: Diagnosis not present

## 2020-01-26 DIAGNOSIS — I5189 Other ill-defined heart diseases: Secondary | ICD-10-CM | POA: Diagnosis not present

## 2020-01-26 DIAGNOSIS — Z0000 Encounter for general adult medical examination without abnormal findings: Secondary | ICD-10-CM | POA: Diagnosis not present

## 2020-01-26 DIAGNOSIS — I10 Essential (primary) hypertension: Secondary | ICD-10-CM | POA: Diagnosis not present

## 2020-01-26 DIAGNOSIS — R0609 Other forms of dyspnea: Secondary | ICD-10-CM | POA: Diagnosis not present

## 2020-01-26 DIAGNOSIS — R011 Cardiac murmur, unspecified: Secondary | ICD-10-CM | POA: Diagnosis not present

## 2020-01-26 DIAGNOSIS — R0600 Dyspnea, unspecified: Secondary | ICD-10-CM | POA: Diagnosis not present

## 2020-01-26 DIAGNOSIS — Z6841 Body Mass Index (BMI) 40.0 and over, adult: Secondary | ICD-10-CM | POA: Diagnosis not present

## 2020-01-26 DIAGNOSIS — R9431 Abnormal electrocardiogram [ECG] [EKG]: Secondary | ICD-10-CM | POA: Diagnosis not present

## 2020-02-29 DIAGNOSIS — Z1211 Encounter for screening for malignant neoplasm of colon: Secondary | ICD-10-CM | POA: Diagnosis not present

## 2020-02-29 DIAGNOSIS — Z1212 Encounter for screening for malignant neoplasm of rectum: Secondary | ICD-10-CM | POA: Diagnosis not present

## 2020-03-03 LAB — COLOGUARD: COLOGUARD: NEGATIVE

## 2020-04-05 DIAGNOSIS — F419 Anxiety disorder, unspecified: Secondary | ICD-10-CM | POA: Diagnosis not present

## 2020-04-05 DIAGNOSIS — I10 Essential (primary) hypertension: Secondary | ICD-10-CM | POA: Diagnosis not present

## 2020-04-05 DIAGNOSIS — Z23 Encounter for immunization: Secondary | ICD-10-CM | POA: Diagnosis not present

## 2020-04-05 DIAGNOSIS — Z6841 Body Mass Index (BMI) 40.0 and over, adult: Secondary | ICD-10-CM | POA: Diagnosis not present

## 2020-04-05 DIAGNOSIS — M810 Age-related osteoporosis without current pathological fracture: Secondary | ICD-10-CM | POA: Diagnosis not present

## 2020-04-05 DIAGNOSIS — F3342 Major depressive disorder, recurrent, in full remission: Secondary | ICD-10-CM | POA: Diagnosis not present

## 2020-04-05 DIAGNOSIS — I5189 Other ill-defined heart diseases: Secondary | ICD-10-CM | POA: Diagnosis not present

## 2020-04-17 ENCOUNTER — Other Ambulatory Visit: Payer: Self-pay | Admitting: Internal Medicine

## 2020-07-06 DIAGNOSIS — F3342 Major depressive disorder, recurrent, in full remission: Secondary | ICD-10-CM | POA: Diagnosis not present

## 2020-07-06 DIAGNOSIS — I5189 Other ill-defined heart diseases: Secondary | ICD-10-CM | POA: Diagnosis not present

## 2020-07-06 DIAGNOSIS — Z6841 Body Mass Index (BMI) 40.0 and over, adult: Secondary | ICD-10-CM | POA: Diagnosis not present

## 2020-07-06 DIAGNOSIS — I10 Essential (primary) hypertension: Secondary | ICD-10-CM | POA: Diagnosis not present

## 2020-07-06 DIAGNOSIS — M7989 Other specified soft tissue disorders: Secondary | ICD-10-CM | POA: Diagnosis not present

## 2020-11-11 ENCOUNTER — Other Ambulatory Visit: Payer: Self-pay | Admitting: Internal Medicine

## 2020-11-22 DIAGNOSIS — F3342 Major depressive disorder, recurrent, in full remission: Secondary | ICD-10-CM | POA: Diagnosis not present

## 2020-11-22 DIAGNOSIS — Z1231 Encounter for screening mammogram for malignant neoplasm of breast: Secondary | ICD-10-CM | POA: Diagnosis not present

## 2020-11-22 DIAGNOSIS — M7989 Other specified soft tissue disorders: Secondary | ICD-10-CM | POA: Diagnosis not present

## 2020-11-22 DIAGNOSIS — Z7689 Persons encountering health services in other specified circumstances: Secondary | ICD-10-CM | POA: Diagnosis not present

## 2020-11-22 DIAGNOSIS — I5189 Other ill-defined heart diseases: Secondary | ICD-10-CM | POA: Diagnosis not present

## 2020-11-22 DIAGNOSIS — R011 Cardiac murmur, unspecified: Secondary | ICD-10-CM | POA: Diagnosis not present

## 2020-11-22 DIAGNOSIS — I10 Essential (primary) hypertension: Secondary | ICD-10-CM | POA: Diagnosis not present

## 2020-11-22 DIAGNOSIS — R9431 Abnormal electrocardiogram [ECG] [EKG]: Secondary | ICD-10-CM | POA: Diagnosis not present

## 2021-01-05 DIAGNOSIS — R062 Wheezing: Secondary | ICD-10-CM | POA: Diagnosis not present

## 2021-01-05 DIAGNOSIS — R0600 Dyspnea, unspecified: Secondary | ICD-10-CM | POA: Diagnosis not present

## 2021-01-05 DIAGNOSIS — R0609 Other forms of dyspnea: Secondary | ICD-10-CM | POA: Diagnosis not present

## 2021-01-05 DIAGNOSIS — I10 Essential (primary) hypertension: Secondary | ICD-10-CM | POA: Diagnosis not present

## 2021-01-05 DIAGNOSIS — R609 Edema, unspecified: Secondary | ICD-10-CM | POA: Diagnosis not present

## 2021-01-26 DIAGNOSIS — E871 Hypo-osmolality and hyponatremia: Secondary | ICD-10-CM | POA: Diagnosis not present

## 2021-01-26 DIAGNOSIS — Z7689 Persons encountering health services in other specified circumstances: Secondary | ICD-10-CM | POA: Diagnosis not present

## 2021-01-26 DIAGNOSIS — F3342 Major depressive disorder, recurrent, in full remission: Secondary | ICD-10-CM | POA: Diagnosis not present

## 2021-01-26 DIAGNOSIS — I10 Essential (primary) hypertension: Secondary | ICD-10-CM | POA: Diagnosis not present

## 2021-01-26 DIAGNOSIS — Z1231 Encounter for screening mammogram for malignant neoplasm of breast: Secondary | ICD-10-CM | POA: Diagnosis not present

## 2021-01-26 DIAGNOSIS — F419 Anxiety disorder, unspecified: Secondary | ICD-10-CM | POA: Diagnosis not present

## 2021-01-26 DIAGNOSIS — I5189 Other ill-defined heart diseases: Secondary | ICD-10-CM | POA: Diagnosis not present

## 2021-01-26 DIAGNOSIS — R7303 Prediabetes: Secondary | ICD-10-CM | POA: Diagnosis not present

## 2021-01-26 DIAGNOSIS — K219 Gastro-esophageal reflux disease without esophagitis: Secondary | ICD-10-CM | POA: Diagnosis not present

## 2021-02-16 DIAGNOSIS — R0600 Dyspnea, unspecified: Secondary | ICD-10-CM | POA: Diagnosis not present

## 2021-03-06 DIAGNOSIS — R0600 Dyspnea, unspecified: Secondary | ICD-10-CM | POA: Diagnosis not present

## 2021-03-08 DIAGNOSIS — R609 Edema, unspecified: Secondary | ICD-10-CM | POA: Diagnosis not present

## 2021-03-08 DIAGNOSIS — I10 Essential (primary) hypertension: Secondary | ICD-10-CM | POA: Diagnosis not present

## 2021-03-08 DIAGNOSIS — I5030 Unspecified diastolic (congestive) heart failure: Secondary | ICD-10-CM | POA: Diagnosis not present

## 2021-03-08 DIAGNOSIS — R0609 Other forms of dyspnea: Secondary | ICD-10-CM | POA: Diagnosis not present

## 2021-03-08 DIAGNOSIS — I272 Pulmonary hypertension, unspecified: Secondary | ICD-10-CM | POA: Diagnosis not present

## 2021-03-22 DIAGNOSIS — I10 Essential (primary) hypertension: Secondary | ICD-10-CM | POA: Diagnosis not present

## 2021-04-12 DIAGNOSIS — I5030 Unspecified diastolic (congestive) heart failure: Secondary | ICD-10-CM | POA: Diagnosis not present

## 2021-04-12 DIAGNOSIS — R0600 Dyspnea, unspecified: Secondary | ICD-10-CM | POA: Diagnosis not present

## 2021-04-12 DIAGNOSIS — R609 Edema, unspecified: Secondary | ICD-10-CM | POA: Diagnosis not present

## 2021-04-12 DIAGNOSIS — I10 Essential (primary) hypertension: Secondary | ICD-10-CM | POA: Diagnosis not present

## 2021-04-12 DIAGNOSIS — R0609 Other forms of dyspnea: Secondary | ICD-10-CM | POA: Diagnosis not present

## 2021-04-12 DIAGNOSIS — R002 Palpitations: Secondary | ICD-10-CM | POA: Diagnosis not present

## 2021-04-12 DIAGNOSIS — I272 Pulmonary hypertension, unspecified: Secondary | ICD-10-CM | POA: Diagnosis not present

## 2021-04-24 DIAGNOSIS — I10 Essential (primary) hypertension: Secondary | ICD-10-CM | POA: Diagnosis not present

## 2021-04-24 DIAGNOSIS — Z0000 Encounter for general adult medical examination without abnormal findings: Secondary | ICD-10-CM | POA: Diagnosis not present

## 2021-04-24 DIAGNOSIS — E559 Vitamin D deficiency, unspecified: Secondary | ICD-10-CM | POA: Diagnosis not present

## 2021-04-24 DIAGNOSIS — Z7689 Persons encountering health services in other specified circumstances: Secondary | ICD-10-CM | POA: Diagnosis not present

## 2021-04-24 DIAGNOSIS — Z23 Encounter for immunization: Secondary | ICD-10-CM | POA: Diagnosis not present

## 2021-04-24 DIAGNOSIS — Z13220 Encounter for screening for lipoid disorders: Secondary | ICD-10-CM | POA: Diagnosis not present

## 2021-04-24 DIAGNOSIS — R351 Nocturia: Secondary | ICD-10-CM | POA: Diagnosis not present

## 2021-04-24 DIAGNOSIS — R7303 Prediabetes: Secondary | ICD-10-CM | POA: Diagnosis not present

## 2021-05-15 DIAGNOSIS — Z13220 Encounter for screening for lipoid disorders: Secondary | ICD-10-CM | POA: Diagnosis not present

## 2021-05-15 DIAGNOSIS — I10 Essential (primary) hypertension: Secondary | ICD-10-CM | POA: Diagnosis not present

## 2021-05-15 DIAGNOSIS — R351 Nocturia: Secondary | ICD-10-CM | POA: Diagnosis not present

## 2021-05-15 DIAGNOSIS — E559 Vitamin D deficiency, unspecified: Secondary | ICD-10-CM | POA: Diagnosis not present

## 2021-05-15 DIAGNOSIS — R7303 Prediabetes: Secondary | ICD-10-CM | POA: Diagnosis not present

## 2021-05-15 DIAGNOSIS — F419 Anxiety disorder, unspecified: Secondary | ICD-10-CM | POA: Diagnosis not present

## 2021-07-13 DIAGNOSIS — Z780 Asymptomatic menopausal state: Secondary | ICD-10-CM | POA: Diagnosis not present

## 2021-07-13 DIAGNOSIS — N939 Abnormal uterine and vaginal bleeding, unspecified: Secondary | ICD-10-CM | POA: Diagnosis not present

## 2021-07-13 DIAGNOSIS — R9389 Abnormal findings on diagnostic imaging of other specified body structures: Secondary | ICD-10-CM | POA: Diagnosis not present

## 2021-07-27 DIAGNOSIS — N840 Polyp of corpus uteri: Secondary | ICD-10-CM | POA: Diagnosis not present

## 2021-07-27 DIAGNOSIS — Z124 Encounter for screening for malignant neoplasm of cervix: Secondary | ICD-10-CM | POA: Diagnosis not present

## 2021-07-27 DIAGNOSIS — R9389 Abnormal findings on diagnostic imaging of other specified body structures: Secondary | ICD-10-CM | POA: Diagnosis not present

## 2021-07-27 DIAGNOSIS — N950 Postmenopausal bleeding: Secondary | ICD-10-CM | POA: Diagnosis not present

## 2021-07-27 DIAGNOSIS — Z6841 Body Mass Index (BMI) 40.0 and over, adult: Secondary | ICD-10-CM | POA: Diagnosis not present

## 2024-05-20 ENCOUNTER — Telehealth: Payer: Self-pay

## 2024-05-20 NOTE — Telephone Encounter (Signed)
 Copied from CRM [id number]. Topic: Appointments - Scheduling Inquiry for Clinic >> May 20, 2024  8:22 AM Mia F wrote: Reason for CRM: Pt would like to re-establish with Dr Geofm. Looks like she has not been seen in about 5 years. She says she had moved away and now is back and would like to re-establish. She is aware that Dr Geofm is not taking new pts at this time. Please contact pt to let her know whether she can re-establish or not.

## 2024-08-05 ENCOUNTER — Other Ambulatory Visit: Payer: Self-pay | Admitting: Internal Medicine

## 2024-08-05 DIAGNOSIS — Z1231 Encounter for screening mammogram for malignant neoplasm of breast: Secondary | ICD-10-CM
